# Patient Record
Sex: Male | Born: 1985 | Race: White | Hispanic: No | Marital: Single | Smoking: Former smoker
Health system: Southern US, Community
[De-identification: ages and names within clinical notes are randomized; demographics above are authoritative.]

## PROBLEM LIST (undated history)

## (undated) DIAGNOSIS — R7303 Prediabetes: Secondary | ICD-10-CM

## (undated) HISTORY — DX: Prediabetes: R73.03

---

## 2014-01-29 LAB — BASIC METABOLIC PANEL
BUN: 12 mg/dL (ref 4–21)
CREATININE: 0.9 mg/dL (ref 0.6–1.3)
GLUCOSE: 100 mg/dL

## 2014-01-29 LAB — HEPATIC FUNCTION PANEL
ALT: 79 U/L — AB (ref 10–40)
AST: 42 U/L — AB (ref 14–40)
Alkaline Phosphatase: 76 U/L (ref 25–125)
BILIRUBIN, TOTAL: 0.6 mg/dL

## 2014-01-29 LAB — LIPID PANEL
CHOLESTEROL: 242 mg/dL — AB (ref 0–200)
HDL: 32 mg/dL — AB (ref 35–70)
LDL Cholesterol: 136 mg/dL
LDl/HDL Ratio: 4.2
Triglycerides: 368 mg/dL — AB (ref 40–160)

## 2015-01-04 DIAGNOSIS — K219 Gastro-esophageal reflux disease without esophagitis: Secondary | ICD-10-CM | POA: Insufficient documentation

## 2015-01-04 DIAGNOSIS — E669 Obesity, unspecified: Secondary | ICD-10-CM | POA: Insufficient documentation

## 2015-01-04 DIAGNOSIS — F32A Depression, unspecified: Secondary | ICD-10-CM | POA: Insufficient documentation

## 2015-01-04 DIAGNOSIS — F329 Major depressive disorder, single episode, unspecified: Secondary | ICD-10-CM | POA: Insufficient documentation

## 2015-01-04 DIAGNOSIS — M5136 Other intervertebral disc degeneration, lumbar region: Secondary | ICD-10-CM | POA: Insufficient documentation

## 2015-01-23 ENCOUNTER — Ambulatory Visit (INDEPENDENT_AMBULATORY_CARE_PROVIDER_SITE_OTHER): Payer: Self-pay | Admitting: Family Medicine

## 2015-01-23 ENCOUNTER — Encounter: Payer: Self-pay | Admitting: Family Medicine

## 2015-01-23 VITALS — BP 128/72 | HR 65 | Temp 98.2°F | Wt 280.0 lb

## 2015-01-23 DIAGNOSIS — H6992 Unspecified Eustachian tube disorder, left ear: Secondary | ICD-10-CM

## 2015-01-23 DIAGNOSIS — J302 Other seasonal allergic rhinitis: Secondary | ICD-10-CM

## 2015-01-23 DIAGNOSIS — H9202 Otalgia, left ear: Secondary | ICD-10-CM

## 2015-01-23 MED ORDER — FLUTICASONE PROPIONATE 50 MCG/ACT NA SUSP
2.0000 | Freq: Every day | NASAL | Status: DC
Start: 2015-01-23 — End: 2016-04-27

## 2015-01-23 MED ORDER — LORATADINE-PSEUDOEPHEDRINE ER 5-120 MG PO TB12: 1.0000 | ORAL_TABLET | Freq: Two times a day (BID) | ORAL | Status: DC

## 2015-01-23 MED ORDER — LORATADINE 10 MG PO TABS: 10.0000 mg | ORAL_TABLET | Freq: Every day | ORAL | Status: DC

## 2015-01-23 NOTE — Progress Notes (Signed)
[name], male   DOB: 04-11-1986, 29 y.o.   MRN: [id number]    Alex Perkins  MRN: [id number] DOB: 04-11-1986  Subjective:  HPI   1. Ear pain, left Patient is a 29 year old male who present today with left ear pain.  He states it began yesterday and he complains of pain, pressure and ringing in the ear. He also felt like when he talked he heard it as an echo.   Patient Active Problem List   Diagnosis Date Noted  . DDD (degenerative disc disease), lumbar 01/04/2015  . Clinical depression 01/04/2015  . Acid reflux 01/04/2015  . Adiposity 01/04/2015    No past medical history on file.  History   Social History  . Marital Status: Single    Spouse Name: N/A  . Number of Children: N/A  . Years of Education: N/A   Occupational History  . Not on file.   Social History Main Topics  . Smoking status: Former Smoker -- 2 years  . Smokeless tobacco: Not on file  . Alcohol Use: No  . Drug Use: No     Comment: marijuana in high school  . Sexual Activity: Not on file   Other Topics Concern  . Not on file   Social History Narrative  . No narrative on file    No outpatient prescriptions prior to visit.   No facility-administered medications prior to visit.    No Known Allergies  Review of Systems  Constitutional: Negative for fever, chills and diaphoresis.  HENT: Positive for ear pain, hearing loss and tinnitus. Negative for congestion, ear discharge, nosebleeds and sore throat.        The left ear feels  as if it wants to pop as if he were in the mountains. This is now feeling better.  Eyes: Negative for blurred vision, double vision, photophobia, pain, discharge and redness.  Respiratory: Negative for cough, hemoptysis, sputum production, shortness of breath and wheezing.   Cardiovascular: Positive for chest pain (patient was treated last visit fir GERD).  Neurological: Positive for dizziness and headaches.   Objective:  BP 128/72 mmHg  Pulse 65   Temp(Src) 98.2 F (36.8 C) (Oral)  Wt 280 lb (127.007 kg)  SpO2 97%  Physical Exam  Constitutional: He is oriented to person, place, and time and well-developed, well-nourished, and in no distress.  HENT:  Head: Normocephalic and atraumatic.  Right Ear: External ear normal.  Left Ear: External ear normal.  Nose: Nose normal.  Mouth/Throat: Oropharynx is clear and moist.  Both TMs are full but otherwise this is a normal exam.  Neck: Normal range of motion. Neck supple.  Cardiovascular: Normal rate, regular rhythm and normal heart sounds.   Pulmonary/Chest: Effort normal and breath sounds normal.  Abdominal: Soft.  Neurological: He is alert and oriented to person, place, and time.  Skin: Skin is warm and dry.  Psychiatric: Mood, memory, affect and judgment normal.    Assessment and Plan :  Ear pain, left  - -Pain most likely due to eustachian tube tube dysfunction due to allergic rhinitis. Try Claritin or cautiously Claritin-D. Also considered fluticasone nasal spray.  Mild metabolic syndrome Patient is working hard on diet and exercise and loss of 11 pounds since we saw back this spring. I praised him for this effort.       -   Julieanne Mansonichard Gilbert MD [facility] 01/23/2015 4:01 PM

## 2015-02-05 ENCOUNTER — Encounter: Payer: Self-pay | Admitting: Family Medicine

## 2015-03-06 ENCOUNTER — Encounter: Payer: Self-pay | Admitting: Family Medicine

## 2015-03-12 ENCOUNTER — Encounter: Payer: Self-pay | Admitting: Family Medicine

## 2016-04-27 ENCOUNTER — Ambulatory Visit (INDEPENDENT_AMBULATORY_CARE_PROVIDER_SITE_OTHER): Payer: Self-pay | Admitting: Family Medicine

## 2016-04-27 ENCOUNTER — Encounter: Payer: Self-pay | Admitting: Family Medicine

## 2016-04-27 VITALS — BP 124/88 | HR 92 | Temp 98.5°F | Resp 16 | Wt 282.0 lb

## 2016-04-27 DIAGNOSIS — G4730 Sleep apnea, unspecified: Secondary | ICD-10-CM | POA: Insufficient documentation

## 2016-04-27 DIAGNOSIS — R42 Dizziness and giddiness: Secondary | ICD-10-CM

## 2016-04-27 MED ORDER — MECLIZINE HCL 25 MG PO TABS: ORAL_TABLET | ORAL | 5 refills | Status: DC

## 2016-04-27 MED ORDER — PROMETHAZINE HCL 25 MG PO TABS: ORAL_TABLET | ORAL | 5 refills | Status: DC

## 2016-04-27 NOTE — Patient Instructions (Signed)
Stay on aspirin 325 mg daily.

## 2016-04-27 NOTE — Progress Notes (Signed)
Patient: Alex Perkins Male    DOB: 08-Jun-1986   30 y.o.   MRN: [id number] Visit Date: 04/27/2016  Today's Provider: Megan Mans, MD   Chief Complaint  Patient presents with  . Hospitalization Follow-up   Subjective:    HPI   Follow up ER visit  Patient was seen in ER at [facility] in [place], [place] on 04/22/2016. He was treated for vertigo. Treatment for this included promethazine and meclizine. Also performed EKG, head scan, blood work- all WNL per pt. He reports good compliance with treatment. He reports this condition is Unchanged. Pt reports dizziness has improved, but pt is still c/o nausea, SOB, lightheadedness, weakness, numbness/tingling in top of head, hands and sides. Also c/o visual disturbances (hard to focus, photosensitivity, "looks like TV static). Pt states that this is worse in his left eye, and can feel his pulse in his eye. Pt also states he is quicker to irritability.  Pt is concerned of cardiac involvement at this point. Has a friend who is an MD who states this could be signs of a carotid blockage, and pt wants to get this checked. Pt denies chest pain, headaches, ear pain/pressure. Pt has took ASA 325 mg 2 tablets po this am, with relief of sx.    ------------------------------------------------------------------------------------     No Known Allergies   Current Outpatient Prescriptions:  .  meclizine (ANTIVERT) 25 MG tablet, TK 1 T PO Q 6 H PRF DIZZINESS, Disp: , Rfl: 0 .  naproxen sodium (ANAPROX) 220 MG tablet, Take 220 mg by mouth 2 (two) times daily with a meal., Disp: , Rfl:  .  promethazine (PHENERGAN) 25 MG tablet, TK 1 T PO Q 6 H PRN NV, Disp: , Rfl: 0  Review of Systems  Constitutional: Positive for appetite change, chills and fatigue. Negative for activity change, diaphoresis, fever and unexpected weight change.  HENT: Negative for ear pain.   Eyes: Positive for photophobia and pain.  Respiratory: Positive for shortness  of breath (intermittent, only when pt sleeps. Wakes up gasping for air.).   Cardiovascular: Negative for chest pain, palpitations and leg swelling.  Gastrointestinal: Negative.   Endocrine: Negative.   Allergic/Immunologic: Negative.   Neurological: Positive for weakness, light-headedness and numbness. Negative for dizziness and headaches.  Psychiatric/Behavioral: Negative.     Social History  Substance Use Topics  . Smoking status: Former Smoker    Years: 2.00  . Smokeless tobacco: Not on file  . Alcohol use No   Objective:   BP 124/88 (BP Location: Left Arm, Patient Position: Sitting, Cuff Size: Large)   Pulse 92   Temp 98.5 F (36.9 C) (Oral)   Resp 16   Wt 282 lb (127.9 kg)   Physical Exam  Constitutional: He is oriented to person, place, and time. He appears well-developed and well-nourished.  HENT:  Head: Normocephalic and atraumatic.  Right Ear: External ear normal.  Left Ear: External ear normal.  Nose: Nose normal.  Mouth/Throat: Oropharynx is clear and moist.  Eyes: No scleral icterus. Right eye exhibits no nystagmus. Left eye exhibits no nystagmus.  peripheral vision WNL.  Neck: No thyromegaly present.  Cardiovascular: Normal rate, regular rhythm, normal heart sounds and intact distal pulses.   No carotid bruit  Pulmonary/Chest: Effort normal and breath sounds normal. No respiratory distress.  Musculoskeletal: He exhibits no edema.  Lymphadenopathy:    He has no cervical adenopathy.  Neurological: He is alert and oriented to person, place,  and time. He has normal reflexes. He displays normal reflexes. No cranial nerve deficit. He exhibits normal muscle tone. Coordination normal.  Non-focal  Skin: Skin is warm and dry.  Psychiatric: He has a normal mood and affect. His behavior is normal. Judgment and thought content normal.        Assessment & Plan:     1. Sleep apnea, unspecified type Scored 11 on the Epworth scale. Denies snoring. Will hold off on  sleep study for now.  2. Dizziness Not to goal. Believe this is secondary to migraine headaches. Will refer to neurology for further evaluation, and to rule out plaque on carotids. Refill medications.  - Ambulatory referral to Neurology - promethazine (PHENERGAN) 25 MG tablet; TK 1 T PO Q 6 H PRN NV  Dispense: 30 tablet; Refill: 5 - meclizine (ANTIVERT) 25 MG tablet; TK 1 T PO Q 6 H PRF DIZZINESS  Dispense: 30 tablet; Refill: 5     Patient seen and examined by Julieanne Mansonichard Gilbert, MD, and note scribed by Allene DillonEmily Drozdowski, CMA.  I have done the exam and reviewed the above chart and it is accurate to the best of my knowledge.  Richard Wendelyn BreslowGilbert Jr, MD  [facility]

## 2016-04-28 ENCOUNTER — Inpatient Hospital Stay: Payer: Self-pay | Admitting: Family Medicine

## 2016-05-07 ENCOUNTER — Encounter: Payer: Self-pay | Admitting: Family Medicine

## 2016-06-23 ENCOUNTER — Encounter: Payer: Self-pay | Admitting: Family Medicine

## 2016-06-23 ENCOUNTER — Ambulatory Visit (INDEPENDENT_AMBULATORY_CARE_PROVIDER_SITE_OTHER): Payer: Self-pay | Admitting: Family Medicine

## 2016-06-23 VITALS — BP 118/78 | HR 94 | Temp 98.4°F | Resp 18 | Wt 287.0 lb

## 2016-06-23 DIAGNOSIS — R739 Hyperglycemia, unspecified: Secondary | ICD-10-CM

## 2016-06-23 DIAGNOSIS — R42 Dizziness and giddiness: Secondary | ICD-10-CM

## 2016-06-23 DIAGNOSIS — F419 Anxiety disorder, unspecified: Secondary | ICD-10-CM

## 2016-06-23 DIAGNOSIS — E785 Hyperlipidemia, unspecified: Secondary | ICD-10-CM

## 2016-06-23 MED ORDER — SERTRALINE HCL 50 MG PO TABS: 50.0000 mg | ORAL_TABLET | Freq: Every day | ORAL | 11 refills | Status: DC

## 2016-06-23 NOTE — Progress Notes (Signed)
Subjective:  HPI Pt is here today for a follow up of vertigo and what he thinks is anxiety. He reports that every since his bout of vertigo and syncope he has had anxiety. He will have a flushed feeling and have chest tightness, dizziness and shaking. He can talk himself calm and the symptoms go away. He reports that the dizziness feels different than the vertigo did in the past. He reports that he has a lot going on with work right now. He also states that he has had high cholesterol in the past and prediabetic. He would like to have his labs checked today also. He had EKG before when he had the episode back in October.   Prior to Admission medications   Medication Sig Start Date End Date Taking? Authorizing Provider  cyanocobalamin 1000 MCG tablet Take 1,000 mcg by mouth daily.   Yes Historical Provider, MD  meclizine (ANTIVERT) 25 MG tablet TK 1 T PO Q 6 H PRF DIZZINESS 04/27/16  Yes Maple Hudsonichard L Kelsa Jaworowski Jr., MD  promethazine (PHENERGAN) 25 MG tablet TK 1 T PO Q 6 H PRN NV 04/27/16  Yes Cherl Gorney Hulen ShoutsL Jon Lall Jr., MD  naproxen sodium (ANAPROX) 220 MG tablet Take 220 mg by mouth 2 (two) times daily with a meal.    Historical Provider, MD    Patient Active Problem List   Diagnosis Date Noted  . Sleep apnea 04/27/2016  . DDD (degenerative disc disease), lumbar 01/04/2015  . Clinical depression 01/04/2015  . Acid reflux 01/04/2015  . Adiposity 01/04/2015    History reviewed. No pertinent past medical history.  Social History   Social History  . Marital status: Single    Spouse name: N/A  . Number of children: N/A  . Years of education: N/A   Occupational History  . Not on file.   Social History Main Topics  . Smoking status: Former Smoker    Years: 2.00  . Smokeless tobacco: Never Used  . Alcohol use No  . Drug use: No     Comment: marijuana in high school  . Sexual activity: Not on file   Other Topics Concern  . Not on file   Social History Narrative  . No narrative on  file    No Known Allergies  Review of Systems  HENT: Negative.   Eyes: Negative.   Respiratory: Positive for shortness of breath.   Cardiovascular: Positive for chest pain (tightness).  Gastrointestinal: Positive for nausea.  Genitourinary: Negative.   Musculoskeletal: Positive for neck pain.  Neurological: Positive for dizziness.  Endo/Heme/Allergies: Negative.   Psychiatric/Behavioral: The patient is nervous/anxious ("shakey").      There is no immunization history on file for this patient.  Objective:  BP 118/78 (BP Location: Left Arm, Patient Position: Sitting, Cuff Size: Large)   Pulse 94   Temp 98.4 F (36.9 C) (Oral)   Resp 18   Wt 287 lb (130.2 kg)   Physical Exam  Constitutional: He is oriented to person, place, and time and well-developed, well-nourished, and in no distress.  HENT:  Head: Normocephalic and atraumatic.  Right Ear: External ear normal.  Left Ear: External ear normal.  Nose: Nose normal.  Mouth/Throat: Oropharynx is clear and moist.  Eyes: Conjunctivae and EOM are normal. Pupils are equal, round, and reactive to light.  Neck: Normal range of motion. Neck supple.  Cardiovascular: Normal rate, regular rhythm, normal heart sounds and intact distal pulses.   Pulmonary/Chest: Effort normal and breath sounds normal.  Musculoskeletal: Normal range of motion.  Neurological: He is alert and oriented to person, place, and time. He has normal reflexes. Gait normal. GCS score is 15.  Nonfocal exam.  Skin: Skin is warm and dry.  Psychiatric: Mood, memory, affect and judgment normal.    Lab Results  Component Value Date   CHOL 242 (A) 01/29/2014   TRIG 368 (A) 01/29/2014   HDL 32 (A) 01/29/2014   LDLCALC 136 01/29/2014    CMP     Component Value Date/Time   BUN 12 01/29/2014   CREATININE 0.9 01/29/2014   AST 42 (A) 01/29/2014   ALT 79 (A) 01/29/2014   ALKPHOS 76 01/29/2014    Assessment and Plan :  1. Dizziness Neurologic exam nonfocal.  Neuro referral includes a complete workup by Dr. Sherryll BurgerShah.. MRI pending. Migraine without headache is the diagnosis by Dr. Sherryll BurgerShah  and I think this is accurate. - CBC with Differential/Platelet  2. Anxiety--Acute on chronic Try Sertraline follow up in 1 month. I do think patient has migraine  issue going on. I think recent symptoms are due to anxiety. I discussed with him through the years and need to lose weight and get lipids down. He is very anxious about all of these issues now. We'll try to avoid benzodiazepines. I think counseling would be very helpful with this patient. - TSH - sertraline (ZOLOFT) 50 MG tablet; Take 1 tablet (50 mg total) by mouth daily.  Dispense: 30 tablet; Refill: 11  3. Hyperlipidemia, unspecified hyperlipidemia type  - Lipid Panel With LDL/HDL Ratio - Comprehensive metabolic panel  4. Hyperglycemia Elevated in the past and at home reading. Follow up pending labs. - Hemoglobin A1c 5. Obesity Changes in diet and exercise habits or stressed. We have been discussing this for years the patient has had a great difficulty in making changes HPI, Exam, and A&P Transcribed under the direction and in the presence of Nikita Surman L. Wendelyn BreslowGilbert Jr, MD  Electronically Signed: Dimas ChyleBrittany Byrd, CMA  Julieanne Mansonichard Renly Roots MD [facility] 06/23/2016 3:09 PM

## 2016-07-03 DIAGNOSIS — F419 Anxiety disorder, unspecified: Secondary | ICD-10-CM

## 2016-07-03 DIAGNOSIS — E785 Hyperlipidemia, unspecified: Secondary | ICD-10-CM

## 2016-07-03 DIAGNOSIS — R42 Dizziness and giddiness: Secondary | ICD-10-CM

## 2016-07-04 LAB — COMPREHENSIVE METABOLIC PANEL
ALBUMIN: 4.7 g/dL (ref 3.5–5.5)
ALT: 67 IU/L — ABNORMAL HIGH (ref 0–44)
AST: 34 IU/L (ref 0–40)
Albumin/Globulin Ratio: 2.1 (ref 1.2–2.2)
Alkaline Phosphatase: 67 IU/L (ref 39–117)
BUN / CREAT RATIO: 11 (ref 9–20)
BUN: 10 mg/dL (ref 6–20)
Bilirubin Total: 0.6 mg/dL (ref 0.0–1.2)
CALCIUM: 9.7 mg/dL (ref 8.7–10.2)
CO2: 21 mmol/L (ref 18–29)
CREATININE: 0.9 mg/dL (ref 0.76–1.27)
Chloride: 101 mmol/L (ref 96–106)
GFR calc Af Amer: 130 mL/min/{1.73_m2} (ref >59)
GFR, EST NON AFRICAN AMERICAN: 113 mL/min/{1.73_m2} (ref >59)
GLOBULIN, TOTAL: 2.2 g/dL (ref 1.5–4.5)
GLUCOSE: 107 mg/dL — AB (ref 65–99)
Potassium: 4.8 mmol/L (ref 3.5–5.2)
SODIUM: 141 mmol/L (ref 134–144)
TOTAL PROTEIN: 6.9 g/dL (ref 6.0–8.5)

## 2016-07-04 LAB — CBC WITH DIFFERENTIAL/PLATELET
BASOS ABS: 0.1 10*3/uL (ref 0.0–0.2)
Basos: 1 %
EOS (ABSOLUTE): 0.1 10*3/uL (ref 0.0–0.4)
Eos: 2 %
HEMOGLOBIN: 16.1 g/dL (ref 13.0–17.7)
Hematocrit: 47 % (ref 37.5–51.0)
Immature Grans (Abs): 0.1 10*3/uL (ref 0.0–0.1)
Immature Granulocytes: 1 %
LYMPHS ABS: 2.9 10*3/uL (ref 0.7–3.1)
Lymphs: 45 %
MCH: 29.7 pg (ref 26.6–33.0)
MCHC: 34.3 g/dL (ref 31.5–35.7)
MCV: 87 fL (ref 79–97)
MONOCYTES: 6 %
Monocytes Absolute: 0.4 10*3/uL (ref 0.1–0.9)
Neutrophils Absolute: 2.9 10*3/uL (ref 1.4–7.0)
Neutrophils: 45 %
PLATELETS: 280 10*3/uL (ref 150–379)
RBC: 5.42 x10E6/uL (ref 4.14–5.80)
RDW: 13.6 % (ref 12.3–15.4)
WBC: 6.4 10*3/uL (ref 3.4–10.8)

## 2016-07-04 LAB — LIPID PANEL WITH LDL/HDL RATIO
CHOLESTEROL TOTAL: 233 mg/dL — AB (ref 100–199)
HDL: 35 mg/dL — ABNORMAL LOW (ref >39)
LDL CALC: 145 mg/dL — AB (ref 0–99)
LDl/HDL Ratio: 4.1 ratio units — ABNORMAL HIGH (ref 0.0–3.6)
TRIGLYCERIDES: 263 mg/dL — AB (ref 0–149)
VLDL Cholesterol Cal: 53 mg/dL — ABNORMAL HIGH (ref 5–40)

## 2016-07-04 LAB — HEMOGLOBIN A1C
ESTIMATED AVERAGE GLUCOSE: 123 mg/dL
HEMOGLOBIN A1C: 5.9 % — AB (ref 4.8–5.6)

## 2016-07-04 LAB — TSH: TSH: 1.41 u[IU]/mL (ref 0.450–4.500)

## 2016-07-07 ENCOUNTER — Telehealth: Payer: Self-pay

## 2016-07-07 NOTE — Telephone Encounter (Signed)
-----   Message from Maple Hudsonichard L Gilbert Jr., MD sent at 07/07/2016  1:43 PM EST ----- Lipids moderately elevated. Patient prediabetic. Fatty liver a little better. Work hard on diet and exercise.

## 2016-07-07 NOTE — Telephone Encounter (Signed)
Patient advised as below. Patient verbalizes understanding and is in agreement with treatment plan.  

## 2016-07-22 ENCOUNTER — Ambulatory Visit: Payer: Self-pay | Admitting: Family Medicine

## 2016-09-10 ENCOUNTER — Ambulatory Visit (INDEPENDENT_AMBULATORY_CARE_PROVIDER_SITE_OTHER): Payer: Self-pay | Admitting: Family Medicine

## 2016-09-10 ENCOUNTER — Encounter: Payer: Self-pay | Admitting: Family Medicine

## 2016-09-10 VITALS — BP 118/82 | HR 84 | Temp 98.4°F | Resp 14 | Wt 283.0 lb

## 2016-09-10 DIAGNOSIS — R519 Headache, unspecified: Secondary | ICD-10-CM

## 2016-09-10 DIAGNOSIS — B349 Viral infection, unspecified: Secondary | ICD-10-CM

## 2016-09-10 DIAGNOSIS — R51 Headache: Secondary | ICD-10-CM

## 2016-09-10 DIAGNOSIS — F419 Anxiety disorder, unspecified: Secondary | ICD-10-CM

## 2016-09-10 NOTE — Patient Instructions (Signed)
Can take Robitussin over the counter. Push fluids, rest. For headache if you want to Aspirin can be used. Follow as needed. Keep your appointment with neurologist and get MRI done.

## 2016-09-10 NOTE — Progress Notes (Signed)
Alex ContesShaun A Perkins  MRN: [id number] DOB: 1986-04-28  Subjective:  HPI  Patient has not felt good for about 3 tyo 4 days now. His kids had fever and their mom has had fever and stomach issue for the past few days. Patient symptoms have been sinus pain, sinus pressure, teeth pain, chest congestion, cough, runny nose, head congestion, headache. No fever, no body aches, no chills. He is concerned with a severe headache he has gotten started yesterday 09/09/16 after coughing with all the drainage he has had he developed a sudden pulsation headache that was located behind the neck and his head and pulsating sensation lasted for about 15 seconds and went away and then just had a mild headache for the rest of the day. This happened today also after coughing. He has been taking Loratadine, using saline spray, and used humidifier. He took Phenergan today due to some nausea cause by the headache. Patient Active Problem List   Diagnosis Date Noted  . Sleep apnea 04/27/2016  . DDD (degenerative disc disease), lumbar 01/04/2015  . Clinical depression 01/04/2015  . Acid reflux 01/04/2015  . Adiposity 01/04/2015    No past medical history on file.  Social History   Social History  . Marital status: Single    Spouse name: N/A  . Number of children: N/A  . Years of education: N/A   Occupational History  . Not on file.   Social History Main Topics  . Smoking status: Former Smoker    Years: 2.00  . Smokeless tobacco: Never Used  . Alcohol use No  . Drug use: No     Comment: marijuana in high school  . Sexual activity: Not on file   Other Topics Concern  . Not on file   Social History Narrative  . No narrative on file    Outpatient Encounter Prescriptions as of 09/10/2016  Medication Sig  . cyanocobalamin 1000 MCG tablet Take 1,000 mcg by mouth daily.  Marland Kitchen. loratadine (CLARITIN) 10 MG tablet Take 10 mg by mouth daily.  . meclizine (ANTIVERT) 25 MG tablet TK 1 T PO Q 6 H PRF DIZZINESS  .  promethazine (PHENERGAN) 25 MG tablet TK 1 T PO Q 6 H PRN NV  . naproxen sodium (ANAPROX) 220 MG tablet Take 220 mg by mouth 2 (two) times daily with a meal.  . [DISCONTINUED] sertraline (ZOLOFT) 50 MG tablet Take 1 tablet (50 mg total) by mouth daily.   No facility-administered encounter medications on file as of 09/10/2016.     No Known Allergies  Review of Systems  Constitutional: Positive for malaise/fatigue.  HENT: Positive for congestion and sinus pain.        Runny nose, sinus pressure, post nasal drainage.  Respiratory: Positive for cough and sputum production. Negative for shortness of breath and wheezing.   Cardiovascular: Negative.        Chest congestion  Gastrointestinal: Positive for nausea.  Musculoskeletal: Positive for myalgias and neck pain.  Neurological: Positive for headaches.  Psychiatric/Behavioral: The patient is nervous/anxious.     Objective:  BP 118/82   Pulse 84   Temp 98.4 F (36.9 C)   Resp 14   Wt 283 lb (128.4 kg)   SpO2 97%   Physical Exam  Constitutional: He is oriented to person, place, and time and well-developed, well-nourished, and in no distress.  HENT:  Head: Normocephalic and atraumatic.  Eyes: Conjunctivae are normal. Pupils are equal, round, and reactive to light. Right eye exhibits nystagmus (  mild). Left eye exhibits nystagmus (mild).  Neck: Normal range of motion. Neck supple.  Cardiovascular: Normal rate, regular rhythm, normal heart sounds and intact distal pulses.   No murmur heard. Pulmonary/Chest: Effort normal and breath sounds normal. No respiratory distress. He has no wheezes.  Neurological: He is alert and oriented to person, place, and time. No cranial nerve deficit. He exhibits normal muscle tone. Gait normal. Coordination normal. GCS score is 15.  Skin: Skin is warm and dry.  Psychiatric: Mood, memory, affect and judgment normal.    Assessment and Plan :  1. Viral infection I think symptoms are viral. Discussed with  patient pushing fluids and continue OTC treatments. Follow as needed. I do not think steroid treatment or antibiotics at this time. 2. Headache in back of head Patient contacted neurologist and made an appointment to follow up on this and re scheduled MRI of the brain. Advised patient to follow through with this. Advised patient to use Meclizine as needed if develops dizziness.  3. Anxiety Patient is better. Did not need to take Zoloft and patient feels fine without medications at this time. Follow. HPI, Exam and A&P transcribed under direction and in the presence of Alex Manson, MD. I have done the exam and reviewed the chart and it is accurate to the best of my knowledge. Dentist has been used and  any errors in dictation or transcription are unintentional. Alex Perkins M.D. [facility]

## 2017-09-07 ENCOUNTER — Ambulatory Visit: Payer: Self-pay | Admitting: Family Medicine

## 2017-09-07 VITALS — BP 120/80 | HR 88 | Temp 97.7°F | Resp 16 | Wt 294.0 lb

## 2017-09-07 DIAGNOSIS — G4730 Sleep apnea, unspecified: Secondary | ICD-10-CM

## 2017-09-07 DIAGNOSIS — E782 Mixed hyperlipidemia: Secondary | ICD-10-CM

## 2017-09-07 DIAGNOSIS — J029 Acute pharyngitis, unspecified: Secondary | ICD-10-CM

## 2017-09-07 LAB — POCT RAPID STREP A (OFFICE): RAPID STREP A SCREEN: NEGATIVE

## 2017-09-07 NOTE — Progress Notes (Signed)
Alex ContesShaun A Perkins  MRN: [id number] DOB: [birth year]  Subjective:  HPI   The patient is a 32 year old male who presents for evaluation of sore throat with white spots.  He states he started with sore throat today.  He has 2 young children that have recently been sick and his wife was diagnosed with the flu last week.  The patient states he does not feel sick he just has the sore throat.   Patient Active Problem List   Diagnosis Date Noted  . Sleep apnea 04/27/2016  . DDD (degenerative disc disease), lumbar 01/04/2015  . Clinical depression 01/04/2015  . Acid reflux 01/04/2015  . Adiposity 01/04/2015    No past medical history on file.  Social History   Socioeconomic History  . Marital status: Single    Spouse name: Not on file  . Number of children: Not on file  . Years of education: Not on file  . Highest education level: Not on file  Social Needs  . Financial resource strain: Not on file  . Food insecurity - worry: Not on file  . Food insecurity - inability: Not on file  . Transportation needs - medical: Not on file  . Transportation needs - non-medical: Not on file  Occupational History  . Not on file  Tobacco Use  . Smoking status: Former Smoker    Years: 2.00  . Smokeless tobacco: Never Used  Substance and Sexual Activity  . Alcohol use: No    Alcohol/week: 0.0 oz  . Drug use: No    Comment: marijuana in high school  . Sexual activity: Not on file  Other Topics Concern  . Not on file  Social History Narrative  . Not on file    Outpatient Encounter Medications as of 09/07/2017  Medication Sig  . [DISCONTINUED] cyanocobalamin 1000 MCG tablet Take 1,000 mcg by mouth daily.  . [DISCONTINUED] loratadine (CLARITIN) 10 MG tablet Take 10 mg by mouth daily.  . [DISCONTINUED] meclizine (ANTIVERT) 25 MG tablet TK 1 T PO Q 6 H PRF DIZZINESS  . [DISCONTINUED] naproxen sodium (ANAPROX) 220 MG tablet Take 220 mg by mouth 2 (two) times daily with a meal.  . [DISCONTINUED]  promethazine (PHENERGAN) 25 MG tablet TK 1 T PO Q 6 H PRN NV   No facility-administered encounter medications on file as of 09/07/2017.     No Known Allergies  Review of Systems  Constitutional: Negative for fever and malaise/fatigue.  HENT: Positive for sore throat. Negative for congestion and sinus pain.   Eyes: Negative.   Respiratory: Positive for cough (last week, none now). Negative for shortness of breath and wheezing.   Cardiovascular: Negative for chest pain and palpitations.  Gastrointestinal: Negative.   Neurological: Negative for weakness.  Endo/Heme/Allergies: Negative.   Psychiatric/Behavioral: Negative.     Objective:  BP 120/80 (BP Location: Right Arm, Patient Position: Sitting, Cuff Size: Normal)   Pulse 88   Temp 97.7 F (36.5 C) (Oral)   Resp 16   Wt 294 lb (133.4 kg)   Oropharynx--clear--no exudates Neck--no adenopathy Lungs-clear COR--RRR   Assessment and Plan :  1. Sore throat Viral--no antibiotics needed. - POCT rapid strep A--negative 2.HLD 3.Obesity These issues discussed at length with pt--goal waist of 36--now 40--check lipids in future.   I have done the exam and reviewed the chart and it is accurate to the best of my knowledge. DentistDragon  technology has been used and  any errors in dictation or transcription are unintentional.  Miguel Aschoff M.D. [facility]

## 2018-03-15 ENCOUNTER — Encounter: Payer: Self-pay | Admitting: Family Medicine

## 2018-04-26 ENCOUNTER — Telehealth: Payer: Self-pay | Admitting: Family Medicine

## 2018-04-26 NOTE — Telephone Encounter (Signed)
I don't see this on his list.  Would you need to see him first?

## 2018-04-26 NOTE — Telephone Encounter (Signed)
Pt needs Promethazine and Meclozine for vertigo.  He says he takes both of these for vertigo  Alex Perkins  CB# [phone]  Thanks Barth Kirks

## 2018-04-27 NOTE — Telephone Encounter (Signed)
Pt called back checking on the status of the medication requests.  Thanks, [employer]

## 2018-04-27 NOTE — Telephone Encounter (Signed)
Dr Sullivan Lone, can we give this to him

## 2018-04-28 MED ORDER — MECLIZINE HCL 25 MG PO TABS: 25.0000 mg | ORAL_TABLET | Freq: Four times a day (QID) | ORAL | 0 refills | Status: DC | PRN

## 2018-04-28 MED ORDER — PROMETHAZINE HCL 25 MG PO TABS: 25.0000 mg | ORAL_TABLET | Freq: Four times a day (QID) | ORAL | 0 refills | Status: DC | PRN

## 2018-04-28 NOTE — Telephone Encounter (Signed)
Ok --1 month--no refills.

## 2018-04-28 NOTE — Telephone Encounter (Signed)
Electronically filed. KW

## 2018-07-04 ENCOUNTER — Telehealth: Payer: Self-pay

## 2018-07-04 ENCOUNTER — Other Ambulatory Visit: Payer: Self-pay | Admitting: Family Medicine

## 2018-07-04 MED ORDER — MECLIZINE HCL 25 MG PO TABS: 25.0000 mg | ORAL_TABLET | Freq: Four times a day (QID) | ORAL | 0 refills | Status: DC | PRN

## 2018-07-04 NOTE — Telephone Encounter (Signed)
Medication has been sent in 

## 2018-07-04 NOTE — Telephone Encounter (Signed)
Pt advised of rx sent to pharmacy.  dbs 

## 2018-07-04 NOTE — Telephone Encounter (Signed)
Please review

## 2018-07-04 NOTE — Telephone Encounter (Signed)
Patient called requesting refills on meclizine. He reports that he has a follow up appt with Dr. Sullivan LoneGilbert on Thurs (1/2), but would like the medication today if possible. Patient uses [facility] on [address]. Please call patient to let him know if this has been called in. Contact info is correct. Thanks!

## 2018-07-07 ENCOUNTER — Encounter: Payer: Self-pay | Admitting: Family Medicine

## 2018-07-07 ENCOUNTER — Ambulatory Visit (INDEPENDENT_AMBULATORY_CARE_PROVIDER_SITE_OTHER): Payer: Self-pay | Admitting: Family Medicine

## 2018-07-07 VITALS — BP 134/93 | HR 86 | Temp 98.2°F | Wt 300.0 lb

## 2018-07-07 DIAGNOSIS — R030 Elevated blood-pressure reading, without diagnosis of hypertension: Secondary | ICD-10-CM

## 2018-07-07 DIAGNOSIS — G43901 Migraine, unspecified, not intractable, with status migrainosus: Secondary | ICD-10-CM

## 2018-07-07 DIAGNOSIS — F418 Other specified anxiety disorders: Secondary | ICD-10-CM

## 2018-07-07 DIAGNOSIS — E785 Hyperlipidemia, unspecified: Secondary | ICD-10-CM

## 2018-07-07 DIAGNOSIS — R42 Dizziness and giddiness: Secondary | ICD-10-CM

## 2018-07-07 NOTE — Progress Notes (Signed)
Patient: Alex Perkins Male    DOB: 05-17-1986   33 y.o.   MRN: [id number] Visit Date: 07/07/2018  Today's Provider: Wilhemena Durie, MD   Chief Complaint  Patient presents with  . Dizziness   Subjective:     Dizziness  This is a recurrent problem. Episode onset: October. Episode frequency: episodes. The problem has been unchanged. Associated symptoms include vertigo and a visual change. Associated symptoms comments: Ear pain, appetite change. Nothing aggravates the symptoms. Treatments tried: Meclizine, Promethazine, and Aleve. The treatment provided mild relief.  The vertigo symptoms occurred in October 2017 and then again in October 2019.  They lasted for short while and resolved.  Visual symptoms are the ones that he continues to have.  They are not continuous.  No visual field defects.  He says he is outside a lot and is worried about the possibility of Lyme's disease.  No Known Allergies   Current Outpatient Medications:  .  meclizine (ANTIVERT) 25 MG tablet, Take 1 tablet (25 mg total) by mouth every 6 (six) hours as needed for dizziness., Disp: 30 tablet, Rfl: 0 .  promethazine (PHENERGAN) 25 MG tablet, Take 1 tablet (25 mg total) by mouth every 6 (six) hours as needed for nausea or vomiting., Disp: 30 tablet, Rfl: 0 .  cyclobenzaprine (FLEXERIL) 10 MG tablet, cyclobenzaprine 10 mg tablet  TK 1 T PO Q 8 H PRF SPASM, Disp: , Rfl:   Review of Systems  Constitutional: Positive for appetite change.  HENT: Positive for sinus pain.        He does complain of sinus congestion after these episodes.  No real pain. He says that when it occurs he puts an ice pack on his neck and in a few hours he feels better  Eyes: Positive for visual disturbance.  Respiratory: Negative.   Cardiovascular: Negative.   Gastrointestinal: Negative.   Endocrine: Negative.   Genitourinary: Negative.   Musculoskeletal: Negative.   Allergic/Immunologic: Negative.   Neurological: Positive for  dizziness and vertigo.  Psychiatric/Behavioral: Negative.     Social History   Tobacco Use  . Smoking status: Former Smoker    Years: 2.00  . Smokeless tobacco: Never Used  Substance Use Topics  . Alcohol use: No    Alcohol/week: 0.0 standard drinks      Objective:   BP (!) 134/93 (BP Location: Right Arm, Patient Position: Sitting, Cuff Size: Large)   Pulse 86   Temp 98.2 F (36.8 C) (Oral)   Wt 300 lb (136.1 kg)  Vitals:   07/07/18 0814  BP: (!) 134/93  Pulse: 86  Temp: 98.2 F (36.8 C)  TempSrc: Oral  Weight: 300 lb (136.1 kg)     Physical Exam Constitutional:      Appearance: Normal appearance. He is normal weight.  HENT:     Head: Normocephalic and atraumatic.     Right Ear: Tympanic membrane, ear canal and external ear normal.     Left Ear: Tympanic membrane, ear canal and external ear normal.     Nose: Nose normal.  Eyes:     Extraocular Movements: Extraocular movements intact.     Conjunctiva/sclera: Conjunctivae normal.     Pupils: Pupils are equal, round, and reactive to light.  Neck:     Musculoskeletal: Normal range of motion and neck supple.  Cardiovascular:     Rate and Rhythm: Normal rate and regular rhythm.     Pulses: Normal pulses.  Heart sounds: Normal heart sounds.  Pulmonary:     Effort: Pulmonary effort is normal.     Breath sounds: Normal breath sounds.  Abdominal:     General: Bowel sounds are normal.  Musculoskeletal: Normal range of motion.  Neurological:     General: No focal deficit present.     Mental Status: He is alert and oriented to person, place, and time. Mental status is at baseline.     Cranial Nerves: No cranial nerve deficit.     Sensory: No sensory deficit.     Motor: No weakness.     Coordination: Coordination normal.     Gait: Gait normal.     Comments: Completely nonfocal exam.  Psychiatric:        Mood and Affect: Mood normal.        Behavior: Behavior normal.        Thought Content: Thought content  normal.        Judgment: Judgment normal.         Assessment & Plan    1. Vertigo Exam  today is normal. - TSH - Sed Rate (ESR) - Ambulatory referral to Neurology - Ambulatory referral to Ophthalmology  2. Hyperlipidemia, unspecified hyperlipidemia type Recheck lipids. - TSH - Lipid Profile - Sed Rate (ESR)  3. Migraine with status migrainosus, not intractable, unspecified migraine type I think this is the most likely diagnosis.  Refer back to neurology.  We will go ahead and refer to see ophthalmology after he sees neurology.  He may not need to go to this appointment if neurology clears him. - TSH - Sed Rate (ESR) - Ambulatory referral to Neurology - Ambulatory referral to Ophthalmology  4. Blood pressure elevated without history of HTN He plans to work on lifestyle this year.  I will see him back in 2 to 4 months to recheck blood pressure. - CBC with Differential - Comprehensive Metabolic Panel (CMET) 5.  Anxiety I think this is become a significant issue for this patient.  Hopefully neurology can give him some answers along with normal lab work that will make him more comfortable.  We will treat as indicated.    Ryen Rhames Cranford Mon, MD  [facility]

## 2018-07-08 LAB — CBC WITH DIFFERENTIAL/PLATELET
BASOS: 1 %
Basophils Absolute: 0 10*3/uL (ref 0.0–0.2)
EOS (ABSOLUTE): 0.2 10*3/uL (ref 0.0–0.4)
Eos: 3 %
HEMOGLOBIN: 16.1 g/dL (ref 13.0–17.7)
Hematocrit: 46.6 % (ref 37.5–51.0)
IMMATURE GRANS (ABS): 0 10*3/uL (ref 0.0–0.1)
Immature Granulocytes: 1 %
LYMPHS: 42 %
Lymphocytes Absolute: 2.3 10*3/uL (ref 0.7–3.1)
MCH: 30.7 pg (ref 26.6–33.0)
MCHC: 34.5 g/dL (ref 31.5–35.7)
MCV: 89 fL (ref 79–97)
Monocytes Absolute: 0.5 10*3/uL (ref 0.1–0.9)
Monocytes: 9 %
Neutrophils Absolute: 2.5 10*3/uL (ref 1.4–7.0)
Neutrophils: 44 %
Platelets: 251 10*3/uL (ref 150–450)
RBC: 5.25 x10E6/uL (ref 4.14–5.80)
RDW: 12.3 % (ref 12.3–15.4)
WBC: 5.5 10*3/uL (ref 3.4–10.8)

## 2018-07-08 LAB — COMPREHENSIVE METABOLIC PANEL
ALBUMIN: 4.6 g/dL (ref 3.5–5.5)
ALT: 67 IU/L — ABNORMAL HIGH (ref 0–44)
AST: 32 IU/L (ref 0–40)
Albumin/Globulin Ratio: 2.2 (ref 1.2–2.2)
Alkaline Phosphatase: 84 IU/L (ref 39–117)
BUN/Creatinine Ratio: 13 (ref 9–20)
BUN: 12 mg/dL (ref 6–20)
Bilirubin Total: 0.5 mg/dL (ref 0.0–1.2)
CO2: 23 mmol/L (ref 20–29)
Calcium: 9.6 mg/dL (ref 8.7–10.2)
Chloride: 103 mmol/L (ref 96–106)
Creatinine, Ser: 0.96 mg/dL (ref 0.76–1.27)
GFR calc non Af Amer: 103 mL/min/{1.73_m2} (ref >59)
GFR, EST AFRICAN AMERICAN: 119 mL/min/{1.73_m2} (ref >59)
GLOBULIN, TOTAL: 2.1 g/dL (ref 1.5–4.5)
Glucose: 127 mg/dL — ABNORMAL HIGH (ref 65–99)
Potassium: 4.8 mmol/L (ref 3.5–5.2)
Sodium: 142 mmol/L (ref 134–144)
TOTAL PROTEIN: 6.7 g/dL (ref 6.0–8.5)

## 2018-07-08 LAB — LIPID PANEL
Chol/HDL Ratio: 6.6 ratio — ABNORMAL HIGH (ref 0.0–5.0)
Cholesterol, Total: 230 mg/dL — ABNORMAL HIGH (ref 100–199)
HDL: 35 mg/dL — ABNORMAL LOW (ref >39)
LDL Calculated: 166 mg/dL — ABNORMAL HIGH (ref 0–99)
Triglycerides: 145 mg/dL (ref 0–149)
VLDL Cholesterol Cal: 29 mg/dL (ref 5–40)

## 2018-07-08 LAB — TSH: TSH: 1.42 u[IU]/mL (ref 0.450–4.500)

## 2018-07-08 LAB — SEDIMENTATION RATE: Sed Rate: 8 mm/hr (ref 0–15)

## 2018-08-31 ENCOUNTER — Encounter: Payer: Self-pay | Admitting: Family Medicine

## 2018-08-31 ENCOUNTER — Other Ambulatory Visit: Payer: Self-pay

## 2018-08-31 ENCOUNTER — Ambulatory Visit (INDEPENDENT_AMBULATORY_CARE_PROVIDER_SITE_OTHER): Payer: Self-pay | Admitting: Family Medicine

## 2018-08-31 VITALS — BP 120/78 | HR 88 | Temp 98.1°F | Ht 73.0 in | Wt 292.4 lb

## 2018-08-31 DIAGNOSIS — G4730 Sleep apnea, unspecified: Secondary | ICD-10-CM

## 2018-08-31 DIAGNOSIS — Z87898 Personal history of other specified conditions: Secondary | ICD-10-CM

## 2018-08-31 DIAGNOSIS — Z6838 Body mass index (BMI) 38.0-38.9, adult: Secondary | ICD-10-CM

## 2018-08-31 DIAGNOSIS — J309 Allergic rhinitis, unspecified: Secondary | ICD-10-CM

## 2018-08-31 DIAGNOSIS — E6609 Other obesity due to excess calories: Secondary | ICD-10-CM

## 2018-08-31 MED ORDER — MONTELUKAST SODIUM 10 MG PO TABS: 10.0000 mg | ORAL_TABLET | Freq: Every day | ORAL | 11 refills | Status: DC

## 2018-08-31 MED ORDER — CETIRIZINE HCL 10 MG PO TABS: 10.0000 mg | ORAL_TABLET | Freq: Every day | ORAL | 11 refills | Status: AC

## 2018-08-31 MED ORDER — FLUTICASONE PROPIONATE 50 MCG/ACT NA SUSP: 2.0000 | Freq: Every day | NASAL | 6 refills | Status: AC

## 2018-08-31 NOTE — Progress Notes (Signed)
Patient: Alex Perkins Male    DOB: 02-Mar-1986   33 y.o.   MRN: [id number] Visit Date: 08/31/2018  Today's Provider: Megan Mans, MD   Chief Complaint  Patient presents with  . Ear Pain    right ear drainage this morning, left ear pain also   Subjective:     Otalgia   There is pain in the left ear. This is a new problem. The current episode started yesterday. The problem occurs hourly. The problem has been waxing and waning. There has been no fever. The pain is at a severity of 2/10. The pain is mild. Associated symptoms include ear discharge (right ear this morning 08/31/18). He has tried NSAIDs and cold packs for the symptoms. The treatment provided mild relief.  He thinks he has ongoing sinus issues that caused some of this lightheaded/dizzy spells.  This is not the same as his severe vertigo he came in with the past.   No Known Allergies   Current Outpatient Medications:  .  cetirizine (ZYRTEC) 10 MG tablet, Take 1 tablet (10 mg total) by mouth daily., Disp: 30 tablet, Rfl: 11 .  cyclobenzaprine (FLEXERIL) 10 MG tablet, cyclobenzaprine 10 mg tablet  TK 1 T PO Q 8 H PRF SPASM, Disp: , Rfl:  .  fluticasone (FLONASE) 50 MCG/ACT nasal spray, Place 2 sprays into both nostrils daily., Disp: 16 g, Rfl: 6 .  meclizine (ANTIVERT) 25 MG tablet, Take 1 tablet (25 mg total) by mouth every 6 (six) hours as needed for dizziness. (Patient not taking: Reported on 08/31/2018), Disp: 30 tablet, Rfl: 0 .  [START ON 09/29/2018] montelukast (SINGULAIR) 10 MG tablet, Take 1 tablet (10 mg total) by mouth at bedtime., Disp: 30 tablet, Rfl: 11 .  promethazine (PHENERGAN) 25 MG tablet, Take 1 tablet (25 mg total) by mouth every 6 (six) hours as needed for nausea or vomiting. (Patient not taking: Reported on 08/31/2018), Disp: 30 tablet, Rfl: 0  Review of Systems  Constitutional: Negative.   HENT: Positive for ear discharge (right ear this morning 08/31/18) and ear pain (left ear started  08/29/18).   Eyes: Negative.   Respiratory: Negative.   Cardiovascular: Negative.   Gastrointestinal: Negative.   Endocrine: Negative.   Genitourinary: Negative.   Musculoskeletal: Negative.   Skin: Negative.   Allergic/Immunologic: Negative.   Neurological: Positive for dizziness (mild).  Hematological: Negative.   Psychiatric/Behavioral: Negative.     Social History   Tobacco Use  . Smoking status: Former Smoker    Years: 2.00  . Smokeless tobacco: Never Used  Substance Use Topics  . Alcohol use: No    Alcohol/week: 0.0 standard drinks      Objective:   BP 120/78 (BP Location: Right Arm, Patient Position: Sitting, Cuff Size: Large)   Pulse 88   Temp 98.1 F (36.7 C) (Oral)   Ht 6\' 1"  (1.854 m)   Wt 292 lb 6.4 oz (132.6 kg)   SpO2 97%   BMI 38.58 kg/m  Vitals:   08/31/18 1332  BP: 120/78  Pulse: 88  Temp: 98.1 F (36.7 C)  TempSrc: Oral  SpO2: 97%  Weight: 292 lb 6.4 oz (132.6 kg)  Height: 6\' 1"  (1.854 m)     Physical Exam Vitals signs reviewed.  Constitutional:      Appearance: Normal appearance. He is normal weight.  HENT:     Head: Normocephalic and atraumatic.     Right Ear: Tympanic membrane, ear canal and external  ear normal.     Left Ear: Tympanic membrane, ear canal and external ear normal.     Ears:     Comments: TMs Full and dull.    Nose: Nose normal.  Eyes:     Extraocular Movements: Extraocular movements intact.     Conjunctiva/sclera: Conjunctivae normal.     Pupils: Pupils are equal, round, and reactive to light.  Neck:     Musculoskeletal: Normal range of motion and neck supple.  Cardiovascular:     Rate and Rhythm: Normal rate and regular rhythm.     Pulses: Normal pulses.     Heart sounds: Normal heart sounds.  Pulmonary:     Effort: Pulmonary effort is normal.     Breath sounds: Normal breath sounds.  Abdominal:     General: Bowel sounds are normal.  Musculoskeletal: Normal range of motion.  Neurological:     General: No  focal deficit present.     Mental Status: He is alert and oriented to person, place, and time. Mental status is at baseline.     Cranial Nerves: No cranial nerve deficit.     Sensory: No sensory deficit.     Motor: No weakness.     Coordination: Coordination normal.     Gait: Gait normal.     Comments: Completely nonfocal exam.  Psychiatric:        Mood and Affect: Mood normal.        Behavior: Behavior normal.        Thought Content: Thought content normal.        Judgment: Judgment normal.         Assessment & Plan    1. Allergic rhinitis, unspecified seasonality, unspecified trigger With eustachian tube dysfunction and serous otitis media.  Try Zyrtec and Flonase for a month.  If not improving add Singulair.  If not improving after that refer to ENT.  Antibiotics not indicated at all. - cetirizine (ZYRTEC) 10 MG tablet; Take 1 tablet (10 mg total) by mouth daily.  Dispense: 30 tablet; Refill: 11 - fluticasone (FLONASE) 50 MCG/ACT nasal spray; Place 2 sprays into both nostrils daily.  Dispense: 16 g; Refill: 6 - montelukast (SINGULAIR) 10 MG tablet; Take 1 tablet (10 mg total) by mouth at bedtime.  Dispense: 30 tablet; Refill: 11  2. Sleep apnea, unspecified type   3. Class 2 obesity due to excess calories without serious comorbidity with body mass index (BMI) of 38.0 to 38.9 in adult Discussed at length with patient.  Waist size  is the main issue that we have talked about.  As he is a fairly muscular man.  4. History of vertigo Dr. Sherryll Burger thought this was from vestibular migraine.  No recent occurrence.  See Dr. Clelia Croft if this recurs.  Never did get the MRI Dr. Sherryll Burger ordered because of cost.    I have done the exam and reviewed the above chart and it is accurate to the best of my knowledge. Dentist has been used in this note in any air is in the dictation or transcription are unintentional.  Megan Mans, MD  [facility]

## 2018-12-08 ENCOUNTER — Ambulatory Visit (INDEPENDENT_AMBULATORY_CARE_PROVIDER_SITE_OTHER): Payer: Medicaid Other | Admitting: Family Medicine

## 2018-12-08 ENCOUNTER — Encounter: Payer: Self-pay | Admitting: Family Medicine

## 2018-12-08 ENCOUNTER — Other Ambulatory Visit: Payer: Self-pay

## 2018-12-08 VITALS — BP 131/88 | HR 93 | Temp 98.4°F | Resp 16 | Wt 293.4 lb

## 2018-12-08 DIAGNOSIS — E119 Type 2 diabetes mellitus without complications: Secondary | ICD-10-CM

## 2018-12-08 DIAGNOSIS — E6609 Other obesity due to excess calories: Secondary | ICD-10-CM | POA: Diagnosis not present

## 2018-12-08 DIAGNOSIS — G43109 Migraine with aura, not intractable, without status migrainosus: Secondary | ICD-10-CM

## 2018-12-08 DIAGNOSIS — H814 Vertigo of central origin: Secondary | ICD-10-CM

## 2018-12-08 DIAGNOSIS — Z6838 Body mass index (BMI) 38.0-38.9, adult: Secondary | ICD-10-CM

## 2018-12-08 DIAGNOSIS — R42 Dizziness and giddiness: Secondary | ICD-10-CM | POA: Diagnosis not present

## 2018-12-08 LAB — POCT GLYCOSYLATED HEMOGLOBIN (HGB A1C): Hemoglobin A1C: 6.3 % — AB (ref 4.0–5.6)

## 2018-12-08 NOTE — Progress Notes (Signed)
Patient: Alex Perkins Male    DOB: 07/15/1985   33 y.o.   MRN: [id number] Visit Date: 12/08/2018  Today's Provider: Megan Mans, MD   Chief Complaint  Patient presents with  . Dizziness   Subjective:     Dizziness  This is a recurrent problem. The current episode started in the past 7 days. The problem occurs daily. The problem has been resolved. Associated symptoms include vertigo. Pertinent negatives include no abdominal pain, anorexia, arthralgias, change in bowel habit, chest pain, chills, congestion, coughing, diaphoresis, fatigue, fever, headaches, joint swelling, myalgias, nausea, neck pain, numbness, rash, sore throat, swollen glands, urinary symptoms, visual change, vomiting or weakness. Nothing aggravates the symptoms. He has tried NSAIDs (Meclizine) for the symptoms. The treatment provided significant relief.  He has no chest pain,syncope. When this happens he does c/o exertional "dizziness." Last fasting BS was 127.  No Known Allergies   Current Outpatient Medications:  .  cetirizine (ZYRTEC) 10 MG tablet, Take 1 tablet (10 mg total) by mouth daily., Disp: 30 tablet, Rfl: 11 .  fluticasone (FLONASE) 50 MCG/ACT nasal spray, Place 2 sprays into both nostrils daily., Disp: 16 g, Rfl: 6 .  cyclobenzaprine (FLEXERIL) 10 MG tablet, cyclobenzaprine 10 mg tablet  TK 1 T PO Q 8 H PRF SPASM, Disp: , Rfl:  .  meclizine (ANTIVERT) 25 MG tablet, Take 1 tablet (25 mg total) by mouth every 6 (six) hours as needed for dizziness. (Patient not taking: Reported on 08/31/2018), Disp: 30 tablet, Rfl: 0 .  montelukast (SINGULAIR) 10 MG tablet, Take 1 tablet (10 mg total) by mouth at bedtime. (Patient not taking: Reported on 12/08/2018), Disp: 30 tablet, Rfl: 11 .  promethazine (PHENERGAN) 25 MG tablet, Take 1 tablet (25 mg total) by mouth every 6 (six) hours as needed for nausea or vomiting. (Patient not taking: Reported on 08/31/2018), Disp: 30 tablet, Rfl: 0  Review of Systems   Constitutional: Negative for chills, diaphoresis, fatigue and fever.  HENT: Negative for congestion and sore throat.   Respiratory: Negative for cough.   Cardiovascular: Negative for chest pain.  Gastrointestinal: Negative for abdominal pain, anorexia, change in bowel habit, nausea and vomiting.  Musculoskeletal: Negative for arthralgias, joint swelling, myalgias and neck pain.  Skin: Negative for rash.  Neurological: Positive for dizziness and vertigo. Negative for weakness, numbness and headaches.    Social History   Tobacco Use  . Smoking status: Former Smoker    Years: 2.00  . Smokeless tobacco: Never Used  Substance Use Topics  . Alcohol use: No    Alcohol/week: 0.0 standard drinks      Objective:   BP 131/88   Pulse 93   Temp 98.4 F (36.9 C) (Oral)   Resp 16   Wt 293 lb 6.4 oz (133.1 kg)   BMI 38.71 kg/m  Vitals:   12/08/18 1349  BP: 131/88  Pulse: 93  Resp: 16  Temp: 98.4 F (36.9 C)  TempSrc: Oral  Weight: 293 lb 6.4 oz (133.1 kg)     Physical Exam Vitals signs reviewed.  Constitutional:      Appearance: Normal appearance. He is normal weight.  HENT:     Head: Normocephalic and atraumatic.     Right Ear: Tympanic membrane, ear canal and external ear normal.     Left Ear: Tympanic membrane, ear canal and external ear normal.     Nose: Nose normal.  Eyes:     Extraocular Movements: Extraocular movements  intact.     Conjunctiva/sclera: Conjunctivae normal.     Pupils: Pupils are equal, round, and reactive to light.  Neck:     Musculoskeletal: Normal range of motion and neck supple.     Vascular: No carotid bruit.  Cardiovascular:     Rate and Rhythm: Normal rate and regular rhythm.     Pulses: Normal pulses.     Heart sounds: Normal heart sounds.  Pulmonary:     Effort: Pulmonary effort is normal.     Breath sounds: Normal breath sounds.  Abdominal:     General: Bowel sounds are normal.  Musculoskeletal: Normal range of motion.  Skin:     General: Skin is warm and dry.  Neurological:     General: No focal deficit present.     Mental Status: He is alert and oriented to person, place, and time. Mental status is at baseline.     Cranial Nerves: No cranial nerve deficit.     Sensory: No sensory deficit.     Motor: No weakness.     Coordination: Coordination normal.     Gait: Gait normal.     Comments: Completely nonfocal exam.  Psychiatric:        Mood and Affect: Mood normal.        Behavior: Behavior normal.        Thought Content: Thought content normal.        Judgment: Judgment normal.         Assessment & Plan    1. Vertigo of central origin  - MR Brain W Wo Contrast; Future - POCT glycosylated hemoglobin (Hb A1C)  2. Dizziness Refer back to Dr Sherryll BurgerShah. - Ambulatory referral to Neurology - Ambulatory referral to Chronic Care Management Services - EKG 12-Lead  3. Migraine aura without headache Per Dr Sherryll BurgerShah.   4. Type 2 diabetes mellitus without complication, without long-term current use of insulin (HCC) New diagnosis. A1C today is 6.3 with last FBS 127.Refer to CCM for diabetic counseling. 5 Obesity RTC 3 months.     Wendelyn Breslow Jr, MD  [facility] I,Kathleen J Wolford,acting as a scribe for Megan Mansichard  Jr, MD.,have documented all relevant documentation on the behalf of Megan Mans  Jr, MD,as directed by  Megan Mansichard  Jr, MD while in the presence of Megan Mansichard  Jr, MD.  [facility]

## 2018-12-09 ENCOUNTER — Ambulatory Visit: Payer: Self-pay | Admitting: Pharmacist

## 2018-12-09 NOTE — Chronic Care Management (AMB) (Signed)
  Care Management   Note  12/09/2018 Name: [name] MRN: [id number] DOB: 11-13-1985  Alex Perkins is a 33 y.o. year old male who sees Maple Hudson., MD for primary care. Dr Sullivan Lone asked the CCM team to consult the patient for assistance with chronic disease management. Referral was placed 12/08/18. Telephone outreach to patient today to introduce care management services.    Alex Perkins was given information about Care Management services today including:  1. Case Management services includes personalized support from designated clinical staff supervised by his physician, including individualized plan of care and coordination with other care providers 2. 24/7 contact phone numbers for assistance for urgent and routine care needs. 3. The patient may stop case management services at any time by phone call to the office staff.  Plan: Patient did not agree to services and wishes to consider information provided before deciding about enrollment in care management services.    Alex Perkins wishes to ascertain the reason for his referral from Dr. Sullivan Lone (he suspects it has to do with prediabetes or diet) before meeting with Yvone Neu, RNCM.   Karalee Height, PharmD Clinical Pharmacist [facility] [phone]

## 2018-12-14 ENCOUNTER — Telehealth: Payer: Self-pay | Admitting: Family Medicine

## 2018-12-14 NOTE — Telephone Encounter (Signed)
Pt's insurance company states that ICD 10 code of H81.4 will need to be changed to different code in order to get MRI approved

## 2018-12-19 ENCOUNTER — Ambulatory Visit: Payer: Self-pay | Admitting: *Deleted

## 2018-12-19 NOTE — Telephone Encounter (Signed)
MRI approval might have to come through Dr. Manuella Ghazi from neurology.  I do not have anything else to add for MRI

## 2018-12-19 NOTE — Chronic Care Management (AMB) (Signed)
   Care Management   Note  Unsuccessful Call Note 12/19/2018 Name: Alex Perkins MRN: [id number] DOB: 12-04-1985  Alex Perkins is a 33 year old male who sees Dr, Gwynneth Albright for primary care. Dr. Rosanna Randy asked the CCM team to consult the patient for Diabetes education.  Referral was placed 12/08/18. Patient's last office visit was 12/08/18.     This social worker was unable to reach patient via telephone today to obtain consent to participate in the case management program. Per last CCM note, patient wanted to consider the information provided before deciding to enroll.  I have left HIPAA compliant voicemail asking patient to return my call. (unsuccessful outreach #1).   Plan: Will follow-up within 7 business days via telephone.      Elliot Gurney, [place] Worker  [place] [facility] Care [facility] [phone]

## 2018-12-29 ENCOUNTER — Ambulatory Visit: Payer: Self-pay | Admitting: Pharmacist

## 2018-12-29 ENCOUNTER — Telehealth: Payer: Self-pay | Admitting: Pharmacist

## 2018-12-29 NOTE — Chronic Care Management (AMB) (Signed)
  Care Management   Note  12/29/2018 Name: Alex Perkins MRN: [id number] DOB: Sep 04, 1985  Alex Perkins is a 33 y.o. year old male who sees Jerrol Banana., MD for primary care. Dr. Rosanna Randy asked the CCM team to consult the patient for assistance with chronic disease management related to prediabetes. Referral was placed 12/08/2018. Telephone outreach to patient today to introduce care management services.   Plan: [name] was given information about Care Management services today including:  1. Case Management services includes personalized support from designated clinical staff supervised by his physician, including individualized plan of care and coordination with other care providers 2. 24/7 contact phone numbers for assistance for urgent and routine care needs. 3. The patient may stop case management services at any time by phone call to the office staff.  Patient did not agree to enrollment in care management services and does not wish to consider at this time.   Ruben Reason, PharmD Clinical Pharmacist [place] [phone]

## 2018-12-29 NOTE — Telephone Encounter (Signed)
Please review. Thanks!  

## 2018-12-29 NOTE — Telephone Encounter (Signed)
We could not get it approved with normal exam. I think neurology can get it approved most likely

## 2018-12-29 NOTE — Telephone Encounter (Signed)
During care management call, patient states he has not heard from imaging.   In referral notes, it looks like referral was closed- due to ICD10 and maybe it needs to come from neurology? Patient is worried because he needs the MRI prior to appointment from neurology and neurology appointment is scheduled.   Patient requesting call back.   Ruben Reason, PharmD Clinical Pharmacist [place] [phone]

## 2018-12-29 NOTE — Patient Instructions (Signed)
Alex Perkins was given information about Care Management services today including:  1. Care Management services includes personalized support from designated clinical staff supervised by his physician, including individualized plan of care and coordination with other care providers 2. 24/7 contact phone numbers for assistance for urgent and routine care needs. 3. The patient may stop case management services at any time by phone call to the office staff.  Patient did not agree to enrollment in care management services and does not wish to consider at this time.

## 2018-12-30 NOTE — Telephone Encounter (Signed)
Tried calling patient and mailbox was full and unable to leave a message. Will try again later.

## 2019-01-04 ENCOUNTER — Encounter: Payer: Self-pay | Admitting: Family Medicine

## 2019-01-05 NOTE — Telephone Encounter (Signed)
Unable to contact the patient. Will save message to the chart.  

## 2019-01-09 NOTE — Telephone Encounter (Signed)
Please call patient at [phone] regarding the MRI that needs to be scheduled.  teri

## 2019-01-10 NOTE — Telephone Encounter (Signed)
Judson Roch, does this need another order? Please advise. Thanks!

## 2019-01-16 DIAGNOSIS — G43109 Migraine with aura, not intractable, without status migrainosus: Secondary | ICD-10-CM | POA: Diagnosis not present

## 2019-01-16 DIAGNOSIS — F411 Generalized anxiety disorder: Secondary | ICD-10-CM | POA: Diagnosis not present

## 2019-02-15 ENCOUNTER — Ambulatory Visit: Payer: Self-pay | Admitting: Pharmacist

## 2019-02-15 DIAGNOSIS — M542 Cervicalgia: Secondary | ICD-10-CM | POA: Diagnosis not present

## 2019-02-15 DIAGNOSIS — E538 Deficiency of other specified B group vitamins: Secondary | ICD-10-CM | POA: Diagnosis not present

## 2019-02-15 DIAGNOSIS — E559 Vitamin D deficiency, unspecified: Secondary | ICD-10-CM | POA: Diagnosis not present

## 2019-02-15 DIAGNOSIS — G43109 Migraine with aura, not intractable, without status migrainosus: Secondary | ICD-10-CM

## 2019-02-16 NOTE — Chronic Care Management (AMB) (Signed)
  Care Management   Note  02/16/2019 Name: FLEM ENDERLE MRN: [id number] DOB: 1985-12-07  Subjective: Mr. Tim Corriher left message on pharmacist phone, pharmacist returning call. HIPAA identifiers verified.  Mr. Dorris states he recently started sumatriptan for migraine headaches and had a few questions regarding the medication and how it worked. Wondered about any interaction with Excedrin migraine relief or promethezine.   Assessment: Patient educated on purpose, proper use and potential adverse effects of sumatriptan.     No further follow up required: Patient does not have CM goals  Ruben Reason, PharmD Clinical Pharmacist [place] [phone]

## 2019-02-22 DIAGNOSIS — G43109 Migraine with aura, not intractable, without status migrainosus: Secondary | ICD-10-CM | POA: Diagnosis not present

## 2019-02-22 DIAGNOSIS — F411 Generalized anxiety disorder: Secondary | ICD-10-CM | POA: Diagnosis not present

## 2019-02-27 ENCOUNTER — Ambulatory Visit: Payer: Self-pay | Admitting: Pharmacist

## 2019-02-27 DIAGNOSIS — H814 Vertigo of central origin: Secondary | ICD-10-CM

## 2019-02-27 DIAGNOSIS — G43109 Migraine with aura, not intractable, without status migrainosus: Secondary | ICD-10-CM

## 2019-02-28 ENCOUNTER — Ambulatory Visit (INDEPENDENT_AMBULATORY_CARE_PROVIDER_SITE_OTHER): Payer: Medicaid Other | Admitting: Family Medicine

## 2019-02-28 ENCOUNTER — Encounter: Payer: Self-pay | Admitting: Family Medicine

## 2019-02-28 ENCOUNTER — Other Ambulatory Visit: Payer: Self-pay

## 2019-02-28 VITALS — BP 123/83 | HR 63 | Temp 98.9°F | Resp 16 | Ht 73.0 in | Wt 267.0 lb

## 2019-02-28 DIAGNOSIS — I2510 Atherosclerotic heart disease of native coronary artery without angina pectoris: Secondary | ICD-10-CM | POA: Diagnosis not present

## 2019-02-28 DIAGNOSIS — E119 Type 2 diabetes mellitus without complications: Secondary | ICD-10-CM

## 2019-02-28 DIAGNOSIS — E785 Hyperlipidemia, unspecified: Secondary | ICD-10-CM | POA: Diagnosis not present

## 2019-02-28 NOTE — Progress Notes (Signed)
Patient: Alex Perkins Male    DOB: [birth year]   33 y.o.   MRN: [id number] Visit Date: 02/28/2019  Today's Provider: Wilhemena Durie, MD   Chief Complaint  Patient presents with  . Follow-up   Subjective:   HPI Patient comes in today to discuss medications. He reports that he had a reaction to a medication that was prescribed by Dr. Manuella Ghazi (Lexapro). He reports that he had chills, nausea, dizziness, and sweats. He reports that he no longer takes the medication. He wanted to get an opinion on something different he could take for his anxiety.  He also says drShah says he had carotid ASCVD/clacification of carotids. No other symptoms.  Patient has also increased his exercise and made lifestyle changes in his diet. He has lost 26lbs in 8 weeks.  Wt Readings from Last 3 Encounters:  02/28/19 267 lb (121.1 kg)  12/08/18 293 lb 6.4 oz (133.1 kg)  08/31/18 292 lb 6.4 oz (132.6 kg)   BP Readings from Last 3 Encounters:  02/28/19 123/83  12/08/18 131/88  08/31/18 120/78    No Known Allergies   Current Outpatient Medications:  .  cyclobenzaprine (FLEXERIL) 10 MG tablet, cyclobenzaprine 10 mg tablet  TK 1 T PO Q 8 H PRF SPASM, Disp: , Rfl:  .  cetirizine (ZYRTEC) 10 MG tablet, Take 1 tablet (10 mg total) by mouth daily. (Patient not taking: Reported on 02/28/2019), Disp: 30 tablet, Rfl: 11 .  fluticasone (FLONASE) 50 MCG/ACT nasal spray, Place 2 sprays into both nostrils daily. (Patient not taking: Reported on 02/28/2019), Disp: 16 g, Rfl: 6 .  meclizine (ANTIVERT) 25 MG tablet, Take 1 tablet (25 mg total) by mouth every 6 (six) hours as needed for dizziness. (Patient not taking: Reported on 08/31/2018), Disp: 30 tablet, Rfl: 0 .  montelukast (SINGULAIR) 10 MG tablet, Take 1 tablet (10 mg total) by mouth at bedtime. (Patient not taking: Reported on 12/08/2018), Disp: 30 tablet, Rfl: 11 .  promethazine (PHENERGAN) 25 MG tablet, Take 1 tablet (25 mg total) by mouth every 6 (six) hours  as needed for nausea or vomiting. (Patient not taking: Reported on 08/31/2018), Disp: 30 tablet, Rfl: 0  Review of Systems  Constitutional: Positive for activity change and fatigue.  HENT: Negative.   Eyes: Negative.   Respiratory: Negative for cough and shortness of breath.   Cardiovascular: Negative for chest pain, palpitations and leg swelling.  Endocrine: Negative.   Musculoskeletal: Negative for arthralgias and myalgias.  Allergic/Immunologic: Negative.   Neurological: Negative for dizziness, tremors, syncope, light-headedness and headaches.  Psychiatric/Behavioral: Negative for agitation, self-injury, sleep disturbance and suicidal ideas. The patient is nervous/anxious.     Social History   Tobacco Use  . Smoking status: Former Smoker    Years: 2.00  . Smokeless tobacco: Never Used  Substance Use Topics  . Alcohol use: No    Alcohol/week: 0.0 standard drinks      Objective:   BP 123/83   Pulse 63   Temp 98.9 F (37.2 C)   Resp 16   Ht 6\' 1"  (1.854 m)   Wt 267 lb (121.1 kg)   BMI 35.23 kg/m  Vitals:   02/28/19 1538  BP: 123/83  Pulse: 63  Resp: 16  Temp: 98.9 F (37.2 C)  Weight: 267 lb (121.1 kg)  Height: 6\' 1"  (1.854 m)     Physical Exam Vitals signs reviewed.  Constitutional:      Appearance: Normal appearance.  HENT:  Head: Normocephalic and atraumatic.     Right Ear: External ear normal.     Left Ear: External ear normal.  Eyes:     General: No scleral icterus. Cardiovascular:     Rate and Rhythm: Normal rate and regular rhythm.     Pulses: Normal pulses.     Heart sounds: Normal heart sounds.  Pulmonary:     Effort: Pulmonary effort is normal.     Breath sounds: Normal breath sounds.  Abdominal:     Palpations: Abdomen is soft.  Skin:    General: Skin is warm and dry.  Neurological:     General: No focal deficit present.     Mental Status: He is alert and oriented to person, place, and time.  Psychiatric:        Mood and Affect:  Mood normal.        Behavior: Behavior normal.        Thought Content: Thought content normal.        Judgment: Judgment normal.      No results found for any visits on 02/28/19.     Assessment & Plan    1. ASCVD (arteriosclerotic cardiovascular disease) Pt wishes CAD risk assessment.  - Ambulatory referral to Cardiology  2. Type 2 diabetes mellitus without complication, without long-term current use of insulin (HCC)  - Comprehensive metabolic panel  3. Hyperlipidemia, unspecified hyperlipidemia type Start statin--lipids 1 month and recheck. - Lipid panel     Megan Mansichard Gilbert Jr, MD  [facility]

## 2019-03-01 ENCOUNTER — Other Ambulatory Visit: Payer: Self-pay

## 2019-03-01 ENCOUNTER — Encounter: Payer: Self-pay | Admitting: Emergency Medicine

## 2019-03-01 ENCOUNTER — Emergency Department: Payer: Medicaid Other

## 2019-03-01 ENCOUNTER — Emergency Department
Admission: EM | Admit: 2019-03-01 | Discharge: 2019-03-01 | Disposition: A | Discharge status: Home / Self Care | Payer: Medicaid Other | Attending: Emergency Medicine | Admitting: Emergency Medicine

## 2019-03-01 DIAGNOSIS — R0789 Other chest pain: Secondary | ICD-10-CM | POA: Insufficient documentation

## 2019-03-01 DIAGNOSIS — R079 Chest pain, unspecified: Secondary | ICD-10-CM | POA: Diagnosis not present

## 2019-03-01 DIAGNOSIS — I2510 Atherosclerotic heart disease of native coronary artery without angina pectoris: Secondary | ICD-10-CM | POA: Insufficient documentation

## 2019-03-01 DIAGNOSIS — E785 Hyperlipidemia, unspecified: Secondary | ICD-10-CM | POA: Diagnosis not present

## 2019-03-01 DIAGNOSIS — E119 Type 2 diabetes mellitus without complications: Secondary | ICD-10-CM | POA: Diagnosis not present

## 2019-03-01 DIAGNOSIS — Z79899 Other long term (current) drug therapy: Secondary | ICD-10-CM | POA: Insufficient documentation

## 2019-03-01 LAB — CBC
HCT: 49.5 % (ref 39.0–52.0)
Hemoglobin: 16.4 g/dL (ref 13.0–17.0)
MCH: 29.1 pg (ref 26.0–34.0)
MCHC: 33.1 g/dL (ref 30.0–36.0)
MCV: 87.9 fL (ref 80.0–100.0)
Platelets: 283 10*3/uL (ref 150–400)
RBC: 5.63 MIL/uL (ref 4.22–5.81)
RDW: 12.5 % (ref 11.5–15.5)
WBC: 7.5 10*3/uL (ref 4.0–10.5)
nRBC: 0 % (ref 0.0–0.2)

## 2019-03-01 LAB — BASIC METABOLIC PANEL
Anion gap: 8 (ref 5–15)
BUN: 14 mg/dL (ref 6–20)
CO2: 22 mmol/L (ref 22–32)
Calcium: 9.4 mg/dL (ref 8.9–10.3)
Chloride: 107 mmol/L (ref 98–111)
Creatinine, Ser: 0.82 mg/dL (ref 0.61–1.24)
GFR calc Af Amer: >60 mL/min (ref >60)
GFR calc non Af Amer: >60 mL/min (ref >60)
Glucose, Bld: 108 mg/dL — ABNORMAL HIGH (ref 70–99)
Potassium: 4.2 mmol/L (ref 3.5–5.1)
Sodium: 137 mmol/L (ref 135–145)

## 2019-03-01 LAB — TROPONIN I (HIGH SENSITIVITY)
Troponin I (High Sensitivity): <2 ng/L (ref <18)
Troponin I (High Sensitivity): 2 ng/L (ref <18)

## 2019-03-01 NOTE — ED Provider Notes (Signed)
[facility] Emergency Department Provider Note  Time seen: 2:36 PM  I have reviewed the triage vital signs and the nursing notes.   HISTORY  Chief Complaint Chest Pain   HPI Alex Perkins is a 33 y.o. male with a past medical history of prediabetes, obesity, migraines, presents to the emergency department for chest pain.  According to the patient approximately 2 weeks ago he started Lexapro for migraines prescribed by his neurologist.  States he was experiencing side effects such as tremors and sweating especially first upon awakening.  Patient states he was told by his neurologist to stop this medication, he stopped it on Monday and states his symptoms have improve tremendously.  Patient states he went to donate blood this morning, around 10:00 after donating blood he developed a pain in his left chest that seem to go into his left neck.  York Spaniel it felt like a "cramping" pain in his chest.  States the pain went away right after that has not returned however patient states his doctor recently diagnosed him with coronary artery disease and had referred him to a cardiologist for a stress test.  Patient was concerned given his chest pain and recent diagnosis of coronary artery disease so he came to the emergency department for evaluation.  Here the patient denies any symptoms.  Denies any chest pain or shortness of breath.  Denies any recent fever cough congestion.   Past Medical History:  Diagnosis Date  . Pre-diabetes     Patient Active Problem List   Diagnosis Date Noted  . Sleep apnea 04/27/2016  . DDD (degenerative disc disease), lumbar 01/04/2015  . Clinical depression 01/04/2015  . Acid reflux 01/04/2015  . Adiposity 01/04/2015    History reviewed. No pertinent surgical history.  Prior to Admission medications   Medication Sig Start Date End Date Taking? Authorizing Provider  cetirizine (ZYRTEC) 10 MG tablet Take 1 tablet (10 mg total) by mouth  daily. Patient not taking: Reported on 02/28/2019 08/31/18   Maple Hudson., MD  cyclobenzaprine (FLEXERIL) 10 MG tablet cyclobenzaprine 10 mg tablet  TK 1 T PO Q 8 H PRF SPASM    [provider]  fluticasone (FLONASE) 50 MCG/ACT nasal spray Place 2 sprays into both nostrils daily. Patient not taking: Reported on 02/28/2019 08/31/18   Maple Hudson., MD  meclizine (ANTIVERT) 25 MG tablet Take 1 tablet (25 mg total) by mouth every 6 (six) hours as needed for dizziness. Patient not taking: Reported on 08/31/2018 07/04/18   Anola Gurney, PA  montelukast (SINGULAIR) 10 MG tablet Take 1 tablet (10 mg total) by mouth at bedtime. Patient not taking: Reported on 12/08/2018 09/29/18   Maple Hudson., MD  promethazine (PHENERGAN) 25 MG tablet Take 1 tablet (25 mg total) by mouth every 6 (six) hours as needed for nausea or vomiting. Patient not taking: Reported on 08/31/2018 04/28/18   Maple Hudson., MD    Allergies  Allergen Reactions  . Lexapro [Escitalopram Oxalate]     Shaking, chills    Family History  Problem Relation Age of Onset  . Diabetes Paternal Grandmother   . Heart attack Paternal Grandfather     Social History Social History   Tobacco Use  . Smoking status: Former Smoker    Years: 2.00  . Smokeless tobacco: Never Used  Substance Use Topics  . Alcohol use: No    Alcohol/week: 0.0 standard drinks  . Drug use: No  Comment: marijuana in high school    Review of Systems Constitutional: Negative for fever ENT: Negative for recent illness/congestion Cardiovascular: Positive for chest pain, now resolved. Respiratory: Negative for shortness of breath. Gastrointestinal: Negative for abdominal pain, vomiting  Musculoskeletal: Negative for musculoskeletal complaints Skin: Negative for skin complaints  Neurological: Negative for headache All other ROS negative  ____________________________________________   PHYSICAL EXAM:  VITAL  SIGNS: ED Triage Vitals  Enc Vitals Group     BP 03/01/19 1145 119/77     Pulse Rate 03/01/19 1145 74     Resp 03/01/19 1145 18     Temp 03/01/19 1145 98.6 F (37 C)     Temp Source 03/01/19 1145 Oral     SpO2 03/01/19 1145 95 %     Weight --      Height --      Head Circumference --      Peak Flow --      Pain Score 03/01/19 1147 0     Pain Loc --      Pain Edu? --      Excl. in [place]? --    Constitutional: Alert and oriented. Well appearing and in no distress. Eyes: Normal exam ENT      Head: Normocephalic and atraumatic.      Mouth/Throat: Mucous membranes are moist. Cardiovascular: Normal rate, regular rhythm.  Respiratory: Normal respiratory effort without tachypnea nor retractions. Breath sounds are clear Gastrointestinal: Soft and nontender. No distention.   Musculoskeletal: Nontender with normal range of motion in all extremities.  Neurologic:  Normal speech and language. No gross focal neurologic deficits  Skin:  Skin is warm, dry and intact.  Psychiatric: Mood and affect are normal.  ____________________________________________    EKG  EKG viewed and interpreted by myself shows a normal sinus rhythm at 100 bpm with a narrow QRS, mild right axis deviation, largely normal intervals, nonspecific ST changes.  ____________________________________________    RADIOLOGY  Chest x-ray negative  ____________________________________________   INITIAL IMPRESSION / ASSESSMENT AND PLAN / ED COURSE  Pertinent labs & imaging results that were available during my care of the patient were reviewed by me and considered in my medical decision making (see chart for details).   Patient presents to the emergency department for left-sided chest pain which he describes as more as a cramping sensation in his chest which resolved after several minutes and has remained resolved.  Differential would include ACS, angina, musculoskeletal pain.  We will check labs to continue to closely  monitor.  Physical exam is reassuring.  Patient's labs are overall reassuring, negative troponin, no acute findings on EKG and a normal chest x-ray.  We will repeat a troponin as a precaution.  If the patient's repeat troponin is negative anticipate likely discharge home.  Patient already has a PCP with good follow-up and has already been referred to a cardiologist for a stress test.  Repeat troponin remains negative.  Patient will be discharged with PCP/cardiology follow-up as scheduled.  Provided my normal chest pain return precautions.  Avian A Truex was evaluated in Emergency Department on 03/01/2019 for the symptoms described in the history of present illness. He was evaluated in the context of the global COVID-19 pandemic, which necessitated consideration that the patient might be at risk for infection with the SARS-CoV-2 virus that causes COVID-19. Institutional protocols and algorithms that pertain to the evaluation of patients at risk for COVID-19 are in a state of rapid change based on information released by regulatory  bodies including the CDC and federal and state organizations. These policies and algorithms were followed during the patient's care in the ED.  ____________________________________________   FINAL CLINICAL IMPRESSION(S) / ED DIAGNOSES  Chest pain   Minna AntisPaduchowski, Nyeli Holtmeyer, MD 03/01/19 1451

## 2019-03-01 NOTE — ED Triage Notes (Signed)
Pt reports he started Lexapro last week. Began to have chills and shaking on Friday. Went to neurologist who prescribed medication yesterday and was told to stop medication (which he did). Pt began to have intermittent left CP and jaw pain yesterday after stopping medication. No SOB. Some lightheadedness when he climbs stairs.  Also reports he has recently been diagnosed with Carotid artery disease.

## 2019-03-02 LAB — COMPREHENSIVE METABOLIC PANEL
ALT: 34 IU/L (ref 0–44)
AST: 20 IU/L (ref 0–40)
Albumin/Globulin Ratio: 1.9 (ref 1.2–2.2)
Albumin: 4.6 g/dL (ref 4.0–5.0)
Alkaline Phosphatase: 73 IU/L (ref 39–117)
BUN/Creatinine Ratio: 12 (ref 9–20)
BUN: 12 mg/dL (ref 6–20)
Bilirubin Total: 0.8 mg/dL (ref 0.0–1.2)
CO2: 21 mmol/L (ref 20–29)
Calcium: 9.9 mg/dL (ref 8.7–10.2)
Chloride: 101 mmol/L (ref 96–106)
Creatinine, Ser: 1.03 mg/dL (ref 0.76–1.27)
GFR calc Af Amer: 108 mL/min/{1.73_m2} (ref >59)
GFR calc non Af Amer: 94 mL/min/{1.73_m2} (ref >59)
Globulin, Total: 2.4 g/dL (ref 1.5–4.5)
Glucose: 106 mg/dL — ABNORMAL HIGH (ref 65–99)
Potassium: 4.6 mmol/L (ref 3.5–5.2)
Sodium: 139 mmol/L (ref 134–144)
Total Protein: 7 g/dL (ref 6.0–8.5)

## 2019-03-02 LAB — LIPID PANEL
Chol/HDL Ratio: 6.6 ratio — ABNORMAL HIGH (ref 0.0–5.0)
Cholesterol, Total: 224 mg/dL — ABNORMAL HIGH (ref 100–199)
HDL: 34 mg/dL — ABNORMAL LOW (ref >39)
LDL Calculated: 155 mg/dL — ABNORMAL HIGH (ref 0–99)
Triglycerides: 176 mg/dL — ABNORMAL HIGH (ref 0–149)
VLDL Cholesterol Cal: 35 mg/dL (ref 5–40)

## 2019-03-02 NOTE — Chronic Care Management (AMB) (Signed)
  Care Management   Note  03/02/2019- LATE ENTRY Name: Alex Perkins MRN: [id number] DOB: 07/20/1985  Subjective: [name] calling to speak with pharmacist regarding concerns over medication initiated by Dr. Manuella Ghazi. HIPAA identifiers verified.  Alex Perkins states he recently started sumatriptan for migraine headaches as well as Lexapro (escitalopram) and was having significant side effects: tingling in hands and feet, night sweats, "mind shakes", nausea. Side effects started approximately Friday, began Lexapro just over 1 week prior to that. Prescribed by Dr. Manuella Ghazi, has called Dr. Trena Platt office but has not heard a response office. Concerned he is experiencing serotonin syndrome (when combined with sumatriptan, has taken 4 sumatriptan doses in the past 10 days).    Assessment: Patient educated side effects of escitalopram. Night sweats, nausea though rare, can happen. Recommend he DC medication today. No need to taper off of escitalpram though it is an SSRI because he has only been taking for 10 days.   Counseled patient that it is extremely unlikely he is suffering from serotonin syndrome. This condition is incredibly rare and is unlikely to occur with a PRN medication such as sumatriptan. It is also likely to have progressed more rapidly by now if he has been experiencing such symptoms since Friday. If he is concerned, consider asking Dr. Manuella Ghazi for labs, but I have consulted with Dr. Rosanna Randy and we both agree that it is incredibly unlikely. Far more likely he is having a very bad reaction to escitalopram combined with anxiety from reading about side effects on the internet. Patient does deny other drug use including amphetamine and opiods, both prescription and nonprescription.   I am concerned by what patient is describing as "mind shakes". Encourage patient to follow up with Dr. Manuella Ghazi as this is not a known side effect of escitalopram and could be a seizure. Caution patient that this is outside  of my area and he needs to be assessed by Dr. Manuella Ghazi.   Plan No further follow up required: Patient does not have CM goals  Ruben Reason, PharmD Clinical Pharmacist [place] [phone]

## 2019-03-03 ENCOUNTER — Telehealth: Payer: Self-pay

## 2019-03-03 MED ORDER — ROSUVASTATIN CALCIUM 10 MG PO TABS: 10.0000 mg | ORAL_TABLET | Freq: Every day | ORAL | 12 refills | Status: DC

## 2019-03-03 NOTE — Telephone Encounter (Signed)
-----   Message from Jerrol Banana., MD sent at 03/02/2019  8:45 AM EDT ----- With recent results would start Rosuvastatin 10mg  nightly. Will retest labs on next visit.

## 2019-03-03 NOTE — Telephone Encounter (Signed)
Patient advised and Rx sent to pharmacy 

## 2019-03-03 NOTE — Telephone Encounter (Signed)
Tried calling patient and no answer. VMbox full and unable to leave a message. Will try again later.  

## 2019-03-15 ENCOUNTER — Ambulatory Visit: Payer: Self-pay | Admitting: Family Medicine

## 2019-06-12 ENCOUNTER — Telehealth: Payer: Self-pay | Admitting: Family Medicine

## 2019-06-12 NOTE — Telephone Encounter (Signed)
Which medication does pt need refilled?

## 2019-06-19 ENCOUNTER — Other Ambulatory Visit: Payer: Self-pay | Admitting: Family Medicine

## 2019-06-20 MED ORDER — PROMETHAZINE HCL 25 MG PO TABS: 25.0000 mg | ORAL_TABLET | Freq: Four times a day (QID) | ORAL | 3 refills | Status: DC | PRN

## 2019-08-04 ENCOUNTER — Ambulatory Visit: Payer: Self-pay

## 2019-08-04 DIAGNOSIS — R7303 Prediabetes: Secondary | ICD-10-CM

## 2019-08-04 NOTE — Telephone Encounter (Signed)
We could just order an A1c for him that he can do without appt then have him f/u about it with Dr. Reece Perkins about result. He could come in today for the A1c.

## 2019-08-04 NOTE — Telephone Encounter (Signed)
Miche or [place], although the glucose is not extremely high, patient is apparently worried.  Is this something you could work in sooner than what they said Dr Reece Agar could do

## 2019-08-04 NOTE — Telephone Encounter (Signed)
I spoke to the patient at length.  He was not really wanting anything to be done right away, he was just looking for some answers and advise.  Apparently his diet had changed recently and he has some changes in activity etc.  He has started back watching his diet and logging his glucose.  He would like to work on it for a while and then get the A1C checked.   I advised him that this would be ok as his reading were not alarming just concerning and for him to continue to watch what he does and he will continue to become more aware of what affects the readings.

## 2019-08-04 NOTE — Telephone Encounter (Signed)
Pt. Is not diabetic, but is concerned about his fasting blood sugars running 130-150. No symptoms. Reports recently he has more sugar in his diet. Concerned "because Dr. Sullivan Lone told me I was pre-diabetic." Requests a virtual visit. No availability until 08/12/19. Pt. Would like a visit sooner. Please advise pt. Contact number [phone].  Reason for Disposition . New onset diabetes suspected (e.g., frequent urination, weakness, weight loss)  Answer Assessment - Initial Assessment Questions 1. BLOOD GLUCOSE: "What is your blood glucose level?"      Fasting 130-150 2. ONSET: "When did you check the blood glucose?"     Checks it different times of the day. 3. USUAL RANGE: "What is your glucose level usually?" (e.g., usual fasting morning value, usual evening value)     120 4. KETONES: "Do you check for ketones (urine or blood test strips)?" If yes, ask: "What does the test show now?"      No 5. TYPE 1 or 2:  "Do you know what type of diabetes you have?"  (e.g., Type 1, Type 2, Gestational; doesn't know)      Not diagnosed 6. INSULIN: "Do you take insulin?" "What type of insulin(s) do you use? What is the mode of delivery? (syringe, pen; injection or pump)?"      No 7. DIABETES PILLS: "Do you take any pills for your diabetes?" If yes, ask: "Have you missed taking any pills recently?"     No diabetic pills 8. OTHER SYMPTOMS: "Do you have any symptoms?" (e.g., fever, frequent urination, difficulty breathing, dizziness, weakness, vomiting)     No 9. PREGNANCY: "Is there any chance you are pregnant?" "When was your last menstrual period?"     N/A  Protocols used: DIABETES - HIGH BLOOD SUGAR-A-AH

## 2019-09-30 IMAGING — CR CHEST - 2 VIEW
1 series · 2 of 2 positions shown · non-contrast
Comparison: None.

CLINICAL DATA: Left-sided chest pain

EXAM:
CHEST - 2 VIEW

[Series 1: dg chest 2 view · 0.14mm/px · 2 of 2 slices shown]
[im 1/2]
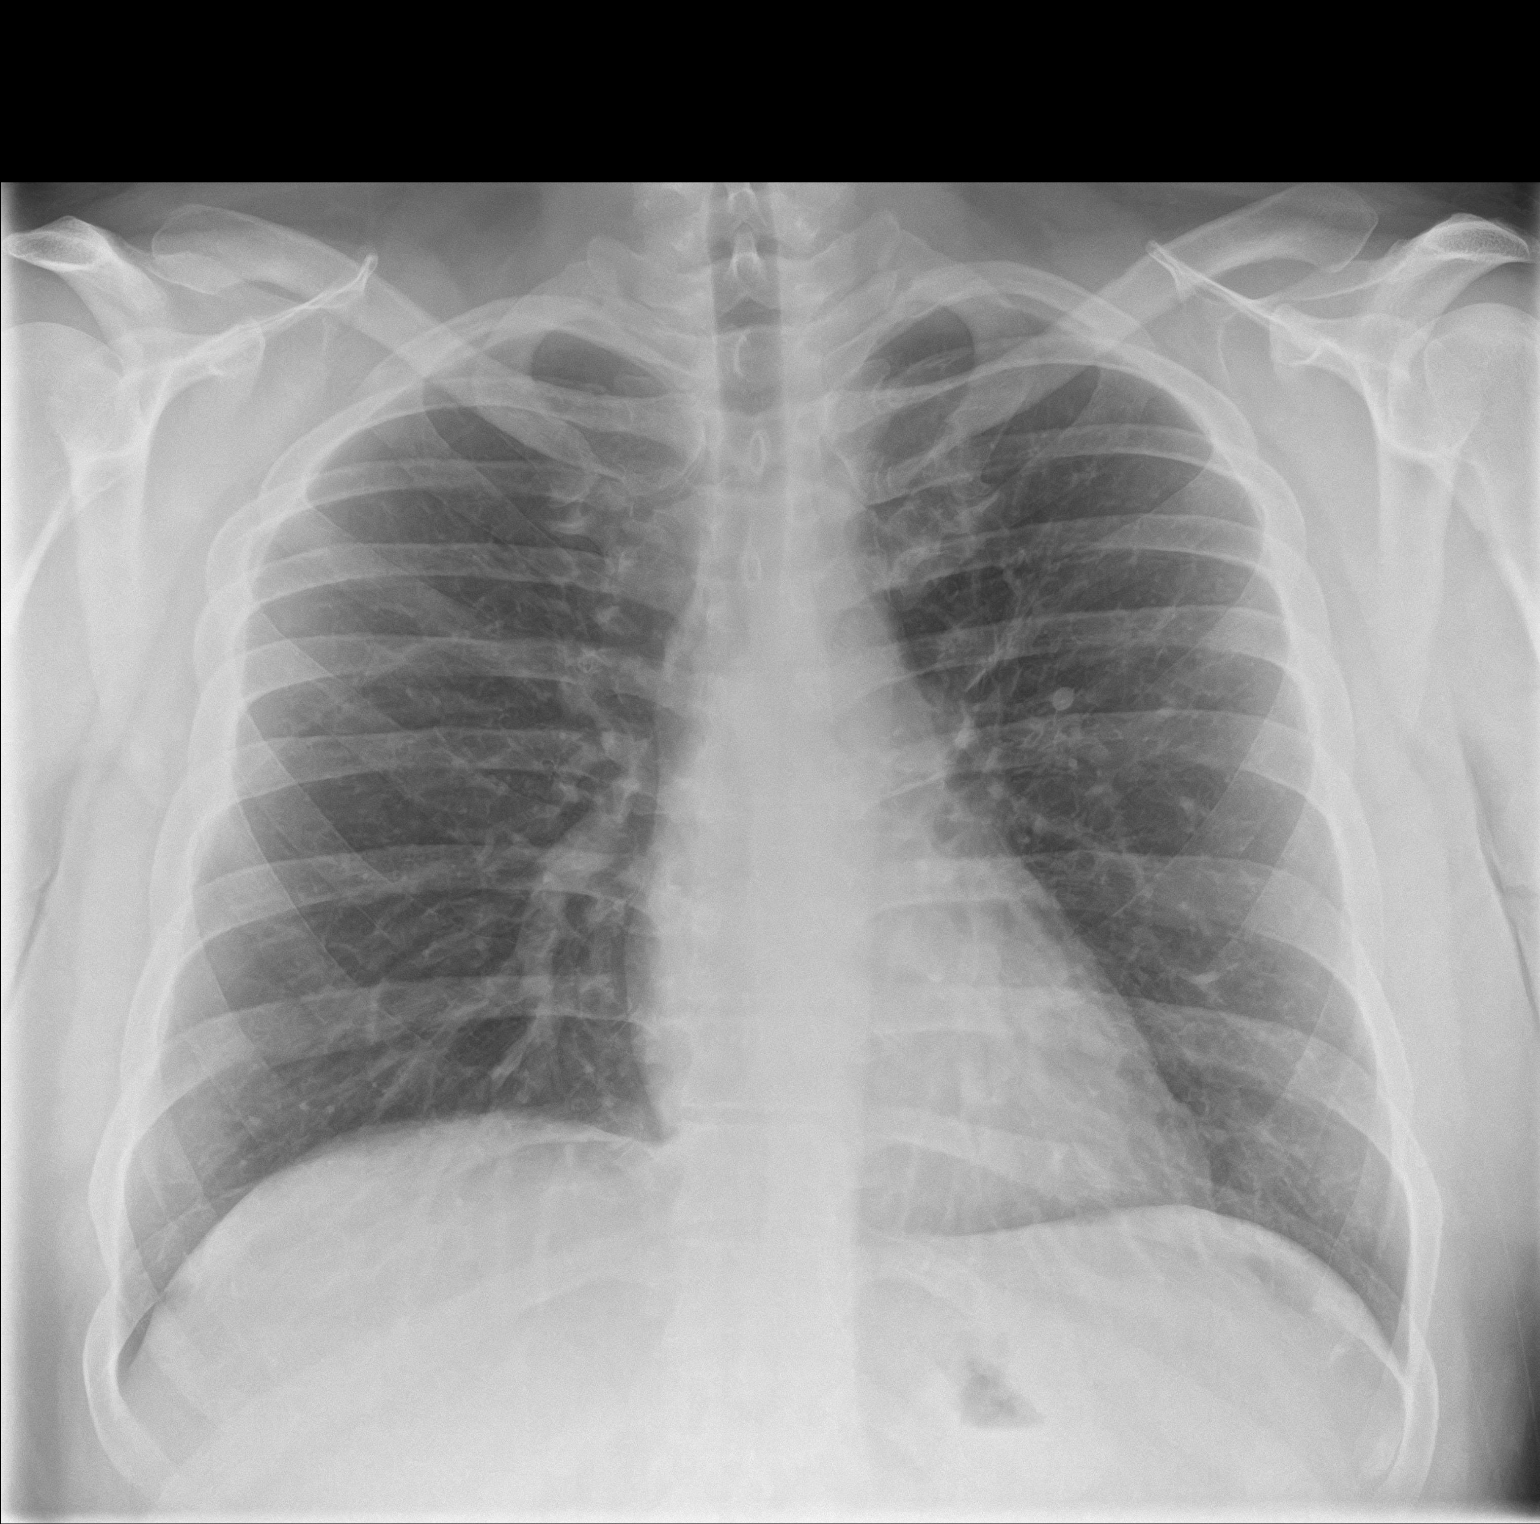
[im 2/2]
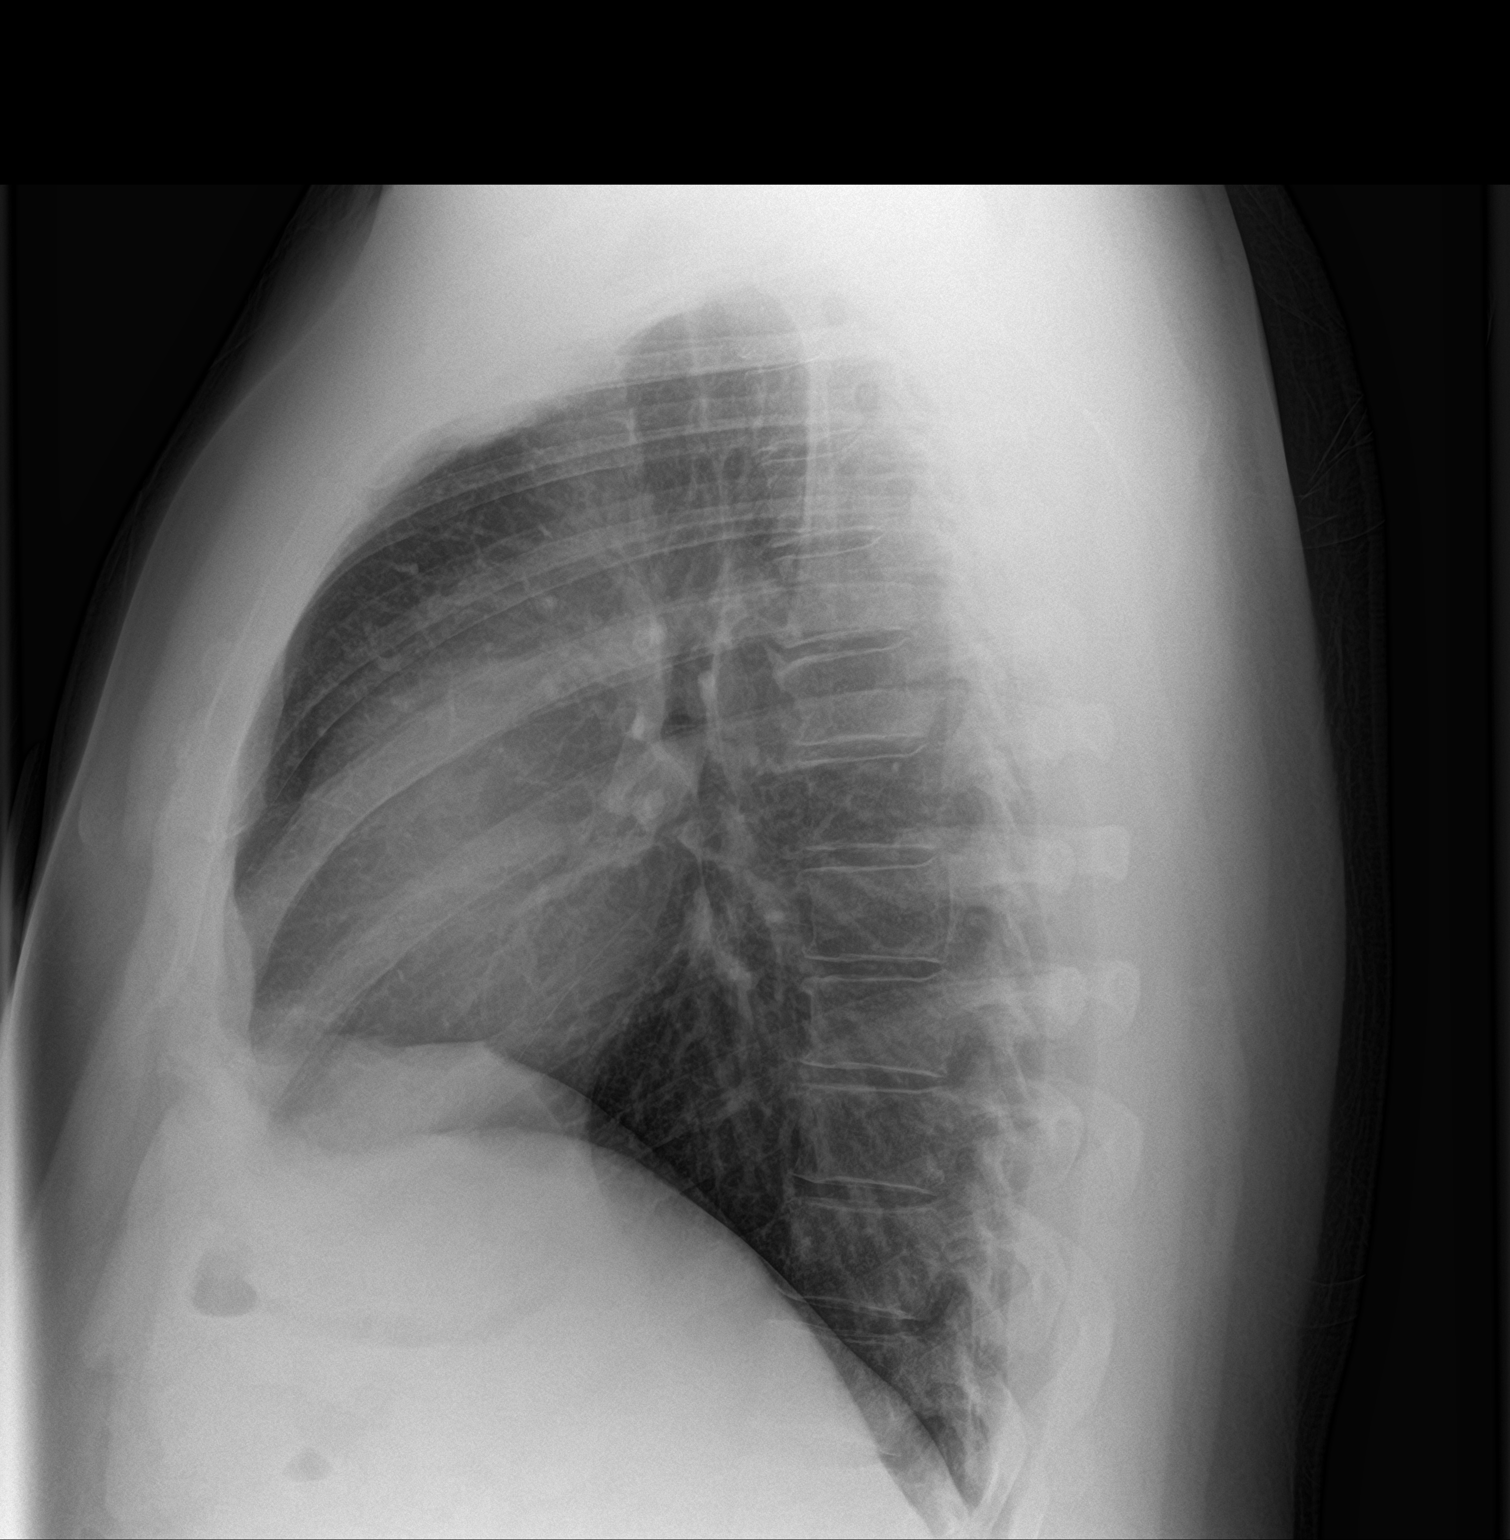

[2 of 2 positions shown; findings below may reference images not displayed]

FINDINGS: The heart size and mediastinal contours are within normal limits.
Both lungs are clear. The visualized skeletal structures are
unremarkable.
IMPRESSION: No acute abnormality of the lungs.

## 2020-03-20 NOTE — Progress Notes (Deleted)
     Established patient visit   Patient: Alex Perkins   DOB: 27-Oct-1985   34 y.o. Male  MRN: [id number] Visit Date: 03/21/2020  Today's healthcare provider: Megan Mans, MD   No chief complaint on file.  Subjective    HPI  ***  {Show patient history (optional):23778::" "}   Medications: Outpatient Medications Prior to Visit  Medication Sig  . cetirizine (ZYRTEC) 10 MG tablet Take 1 tablet (10 mg total) by mouth daily. (Patient not taking: Reported on 02/28/2019)  . cyclobenzaprine (FLEXERIL) 10 MG tablet cyclobenzaprine 10 mg tablet  TK 1 T PO Q 8 H PRF SPASM  . fluticasone (FLONASE) 50 MCG/ACT nasal spray Place 2 sprays into both nostrils daily. (Patient not taking: Reported on 02/28/2019)  . meclizine (ANTIVERT) 25 MG tablet Take 1 tablet (25 mg total) by mouth every 6 (six) hours as needed for dizziness. (Patient not taking: Reported on 08/31/2018)  . montelukast (SINGULAIR) 10 MG tablet Take 1 tablet (10 mg total) by mouth at bedtime. (Patient not taking: Reported on 12/08/2018)  . promethazine (PHENERGAN) 25 MG tablet Take 1 tablet (25 mg total) by mouth every 6 (six) hours as needed for nausea or vomiting.  . rosuvastatin (CRESTOR) 10 MG tablet Take 1 tablet (10 mg total) by mouth daily.   No facility-administered medications prior to visit.    Review of Systems  {Heme  Chem  Endocrine  Serology  Results Review (optional):23779::" "}  Objective    There were no vitals taken for this visit. {Show previous vital signs (optional):23777::" "}  Physical Exam  ***  No results found for any visits on 03/21/20.  Assessment & Plan     ***  No follow-ups on file.      {provider attestation***:1}   Megan Mans, MD  [facility] [phone] (phone) [phone] (fax)  [facility]

## 2020-03-21 ENCOUNTER — Ambulatory Visit: Payer: Medicaid Other | Admitting: Family Medicine

## 2020-03-22 ENCOUNTER — Other Ambulatory Visit: Payer: Self-pay | Admitting: Family Medicine

## 2020-03-22 DIAGNOSIS — G43109 Migraine with aura, not intractable, without status migrainosus: Secondary | ICD-10-CM

## 2020-03-22 NOTE — Telephone Encounter (Signed)
Requested medication (s) are due for refill today: yes   Requested medication (s) are on the active medication list: yes  Last refill:  06/10/2019  Future visit scheduled: no  Notes to clinic: Patient had appointment yesterday that was canceled  Please advise    Requested Prescriptions  Pending Prescriptions Disp Refills   SUMAtriptan (IMITREX) 100 MG tablet [Pharmacy Med Name: SUMATRIPTAN 100MG  TABLETS] 18 tablet     Sig: TAKE 1 TABLET BY MOUTH ONCE AS NEEDED FOR MIGRAINE FOR UP TO 1 DOSE. MAY TAKE A SECOND DOSE AFTER 2 HOURS IF NEEDED      Neurology:  Migraine Therapy - Triptan Failed - 03/22/2020 11:05 AM      Failed - Valid encounter within last 12 months    Recent Outpatient Visits           1 year ago ASCVD (arteriosclerotic cardiovascular disease)   [facility] 03/24/2020., MD   1 year ago Dizziness   [facility] MD   1 year ago Allergic rhinitis, unspecified seasonality, unspecified trigger   [facility] MD   1 year ago Vertigo   [facility] MD   2 years ago Sore throat   [facility] MD              Passed - Last BP in normal range    BP Readings from Last 1 Encounters:  03/01/19 121/64            SUMAtriptan (IMITREX) 100 MG tablet [Pharmacy Med Name: SUMATRIPTAN 100MG  TABLETS] 18 tablet     Sig: TAKE 1 TABLET BY MOUTH ONCE AS NEEDED FOR MIGRAINE FOR UP TO 1 DOSE. MAY TAKE A SECOND DOSE AFTER 2 HOURS IF NEEDED      Neurology:  Migraine Therapy - Triptan Failed - 03/22/2020 11:05 AM      Failed - Valid encounter within last 12 months    Recent Outpatient Visits           1 year ago ASCVD (arteriosclerotic cardiovascular disease)   [facility] 03/24/2020., MD   1 year ago Dizziness   [facility] Maple Hudson., MD   1 year ago  Allergic rhinitis, unspecified seasonality, unspecified trigger   [facility] Maple Hudson., MD   1 year ago Vertigo   [facility] Maple Hudson., MD   2 years ago Sore throat   [facility] Maple Hudson., MD              Passed - Last BP in normal range    BP Readings from Last 1 Encounters:  03/01/19 121/64

## 2020-07-30 ENCOUNTER — Other Ambulatory Visit: Payer: Self-pay | Admitting: Family Medicine

## 2020-08-01 NOTE — Telephone Encounter (Signed)
Pt calling in regarding this prescription. Please advise.

## 2020-12-25 DIAGNOSIS — S39012A Strain of muscle, fascia and tendon of lower back, initial encounter: Secondary | ICD-10-CM | POA: Diagnosis not present

## 2020-12-25 DIAGNOSIS — M5416 Radiculopathy, lumbar region: Secondary | ICD-10-CM | POA: Diagnosis not present

## 2020-12-25 DIAGNOSIS — M545 Low back pain, unspecified: Secondary | ICD-10-CM | POA: Diagnosis not present

## 2021-02-25 ENCOUNTER — Other Ambulatory Visit: Payer: Self-pay | Admitting: Family Medicine

## 2021-02-25 DIAGNOSIS — G43109 Migraine with aura, not intractable, without status migrainosus: Secondary | ICD-10-CM

## 2021-02-25 NOTE — Telephone Encounter (Signed)
Patient called, left VM to return the call to the office to schedule a visit. Last OV 02/2019 with 2 cancellations, unable to refill.

## 2021-02-25 NOTE — Telephone Encounter (Signed)
Requested medication (s) are due for refill today:Yes  Requested medication (s) are on the active medication list:Yes  Last refill:  6 months, 11 months, 1 year ago  Future visit scheduled: No  Notes to clinic:  Unable to refill per protocol, cannot delegate, office visit needed, message left to call office.     Requested Prescriptions  Pending Prescriptions Disp Refills   promethazine (PHENERGAN) 25 MG tablet 30 tablet 3    Sig: Take 1 tablet (25 mg total) by mouth every 6 (six) hours as needed for nausea or vomiting.     Not Delegated - Gastroenterology: Antiemetics Failed - 02/25/2021 12:25 PM      Failed - This refill cannot be delegated      Failed - Valid encounter within last 6 months    Recent Outpatient Visits           1 year ago ASCVD (arteriosclerotic cardiovascular disease)   [facility] Maple Hudson., MD   2 years ago Dizziness   [facility] Maple Hudson., MD   2 years ago Allergic rhinitis, unspecified seasonality, unspecified trigger   [facility] Maple Hudson., MD   2 years ago Vertigo   [facility] Maple Hudson., MD   3 years ago Sore throat   [facility] Maple Hudson[facility] MD               SUMAtriptan (IMITREX) 100 MG tablet 18 tablet 0    Sig: May repeat in 2 hours if headache persists or recurs.     Neurology:  Migraine Therapy - Triptan Failed - 02/25/2021 12:25 PM      Failed - Valid encounter within last 12 months    Recent Outpatient Visits           1 year ago ASCVD (arteriosclerotic cardiovascular disease)   [facility] Maple Hudson., MD   2 years ago Dizziness   [facility] Maple Hudson., MD   2 years ago Allergic rhinitis, unspecified seasonality, unspecified trigger   [facility] Maple Hudson., MD   2 years ago Vertigo   [facility] Maple Hudson., MD   3 years ago Sore throat   [facility] Maple Hudson., MD              Passed - Last BP in normal range    BP Readings from Last 1 Encounters:  03/01/19 121/64           meclizine (ANTIVERT) 25 MG tablet 30 tablet 0     Not Delegated - Gastroenterology: Antiemetics Failed - 02/25/2021 12:25 PM      Failed - This refill cannot be delegated      Failed - Valid encounter within last 6 months    Recent Outpatient Visits           1 year ago ASCVD (arteriosclerotic cardiovascular disease)   [facility] Maple Hudson., MD   2 years ago Dizziness   [facility] Maple Hudson., MD   2 years ago Allergic rhinitis, unspecified seasonality, unspecified trigger   [facility] Maple Hudson., MD   2 years ago Vertigo   [facility] Maple Hudson., MD   3 years ago Sore throat   [facility] Maple Hudson., MD

## 2021-02-25 NOTE — Telephone Encounter (Signed)
Medication Refill - Medication: promethazine (PHENERGAN) 25 MG tablet  SUMAtriptan (IMITREX) 100 MG tablet  meclizine (ANTIVERT) 25 MG tablet  Has the patient contacted their pharmacy? Yes.   (Agent: If no, request that the patient contact the pharmacy for the refill.) (Agent: If yes, when and what did the pharmacy advise?)no refills or expired Rx   Preferred Pharmacy (with phone number or street name): [facility][id number] Nicholes Rough, [place] - [address] AT [facility] OF [address]  [address] [place], [place] [address]  Phone:  [phone]  Fax:  [phone]   Agent: Please be advised that RX refills may take up to 3 business days. We ask that you follow-up with your pharmacy.

## 2021-02-27 ENCOUNTER — Ambulatory Visit: Payer: Medicaid Other | Admitting: Family Medicine

## 2021-02-27 ENCOUNTER — Encounter: Payer: Self-pay | Admitting: Family Medicine

## 2021-02-27 ENCOUNTER — Other Ambulatory Visit: Payer: Self-pay

## 2021-02-27 VITALS — BP 139/100 | HR 90 | Temp 97.5°F | Ht 73.0 in | Wt 300.0 lb

## 2021-02-27 DIAGNOSIS — R7303 Prediabetes: Secondary | ICD-10-CM | POA: Diagnosis not present

## 2021-02-27 DIAGNOSIS — I10 Essential (primary) hypertension: Secondary | ICD-10-CM

## 2021-02-27 DIAGNOSIS — G43911 Migraine, unspecified, intractable, with status migrainosus: Secondary | ICD-10-CM | POA: Diagnosis not present

## 2021-02-27 DIAGNOSIS — R030 Elevated blood-pressure reading, without diagnosis of hypertension: Secondary | ICD-10-CM | POA: Diagnosis not present

## 2021-02-27 DIAGNOSIS — E785 Hyperlipidemia, unspecified: Secondary | ICD-10-CM | POA: Diagnosis not present

## 2021-02-27 DIAGNOSIS — Z0000 Encounter for general adult medical examination without abnormal findings: Secondary | ICD-10-CM | POA: Diagnosis not present

## 2021-02-27 DIAGNOSIS — E6609 Other obesity due to excess calories: Secondary | ICD-10-CM | POA: Diagnosis not present

## 2021-02-27 DIAGNOSIS — Z6838 Body mass index (BMI) 38.0-38.9, adult: Secondary | ICD-10-CM

## 2021-02-27 NOTE — Progress Notes (Signed)
Established patient visit   Patient: Alex Perkins   DOB: [birth year]   35 y.o. Male  MRN: [id number] Visit Date: 02/27/2021  Today's healthcare provider: Megan Mans, MD   Chief Complaint  Patient presents with   Migraine  Complete physical exam Subjective   Patient comes in today for physical.  He has not had a physical in several years. He is married and a father of 2.  He gets some regular exercise now. -------------------------------------------------------------------------------------------------------------------- Migraine  This is a recurrent problem. The problem has been gradually worsening. The pain quality is similar to prior headaches. The patient is experiencing no pain. Associated symptoms include dizziness. Pertinent negatives include no eye pain, eye redness, photophobia or seizures.        Medications: Outpatient Medications Prior to Visit  Medication Sig   cetirizine (ZYRTEC) 10 MG tablet Take 1 tablet (10 mg total) by mouth daily.   cyclobenzaprine (FLEXERIL) 10 MG tablet cyclobenzaprine 10 mg tablet  TK 1 T PO Q 8 H PRF SPASM   fluticasone (FLONASE) 50 MCG/ACT nasal spray Place 2 sprays into both nostrils daily.   meclizine (ANTIVERT) 25 MG tablet TAKE 1 TABLET(25 MG) BY MOUTH EVERY 6 HOURS AS NEEDED FOR DIZZINESS   promethazine (PHENERGAN) 25 MG tablet Take 1 tablet (25 mg total) by mouth every 6 (six) hours as needed for nausea or vomiting.   SUMAtriptan (IMITREX) 100 MG tablet TAKE 1 TABLET BY MOUTH ONCE AS NEEDED FOR MIGRAINE FOR UP TO 1 DOSE. MAY TAKE A SECOND DOSE AFTER 2 HOURS IF NEEDED   montelukast (SINGULAIR) 10 MG tablet Take 1 tablet (10 mg total) by mouth at bedtime.   rosuvastatin (CRESTOR) 10 MG tablet Take 1 tablet (10 mg total) by mouth daily. (Patient not taking: Reported on 02/27/2021)   SUMAtriptan (IMITREX) 100 MG tablet TAKE 1 TABLET BY MOUTH ONCE AS NEEDED FOR MIGRAINE FOR UP TO 1 DOSE. MAY TAKE A SECOND DOSE AFTER 2 HOURS IF  NEEDED   No facility-administered medications prior to visit.    Review of Systems  Constitutional: Negative.   Eyes:  Negative for photophobia, pain, discharge, redness, itching and visual disturbance.  Respiratory: Negative.    Cardiovascular: Negative.   Gastrointestinal: Negative.   Neurological:  Positive for dizziness. Negative for seizures, syncope, light-headedness and headaches.      Objective  -------------------------------------------------------------------------------------------------------------------- BP (!) 139/100 (BP Location: Left Arm, Patient Position: Sitting, Cuff Size: Large)   Pulse 90   Temp (!) 97.5 F (36.4 C) (Oral)   Ht 6\' 1"  (1.854 m)   Wt 300 lb (136.1 kg)   BMI 39.58 kg/m     Physical Exam Vitals reviewed.  Constitutional:      Appearance: Normal appearance. He is obese.     Comments: He is a muscular but overweight gentleman in no acute distress  HENT:     Head: Normocephalic and atraumatic.     Right Ear: External ear normal.     Left Ear: External ear normal.     Mouth/Throat:     Mouth: Mucous membranes are dry.  Eyes:     General: No scleral icterus. Cardiovascular:     Rate and Rhythm: Normal rate and regular rhythm.     Pulses: Normal pulses.     Heart sounds: Normal heart sounds.  Pulmonary:     Effort: Pulmonary effort is normal.     Breath sounds: Normal breath sounds.  Abdominal:     Palpations:  Abdomen is soft.  Skin:    General: Skin is warm and dry.  Neurological:     General: No focal deficit present.     Mental Status: He is alert and oriented to person, place, and time.  Psychiatric:        Mood and Affect: Mood normal.        Behavior: Behavior normal.        Thought Content: Thought content normal.        Judgment: Judgment normal.      No results found for any visits on 02/27/21.  Assessment & Plan   ---------------------------------------------------------------------------------------------------------------------- 1. Annual physical exam  - CBC - Comprehensive metabolic panel - Lipid panel - TSH - Hemoglobin A1c  2. Hyperlipidemia, unspecified hyperlipidemia type  - Comprehensive metabolic panel - Lipid panel  3. Prediabetes Obtain A1c - Comprehensive metabolic panel - Hemoglobin A1c  4. Class 2 obesity due to excess calories without serious comorbidity with body mass index (BMI) of 38.0 to 38.9 in adult Goal waist size of 36  5. Intractable migraine with status migrainosus, unspecified migraine type Clinically stable on Imitrex.  Consider Nurtec  6. Elevated blood pressure reading Check blood pressure readings at home and follow-up in January.  Work on weight loss and aerobic exercise   No follow-ups on file.      I, Alex Mans, MD, have reviewed all documentation for this visit. The documentation on 03/06/21 for the exam, diagnosis, procedures, and orders are all accurate and complete.    Alex Mccadden Wendelyn Breslow, MD  [facility] [phone] (phone) [phone] (fax)  [facility]

## 2021-02-27 NOTE — Patient Instructions (Signed)
Use Omorion or [place]lyn BP cuff to monitor BP at home.

## 2021-02-28 ENCOUNTER — Other Ambulatory Visit: Payer: Self-pay | Admitting: Family Medicine

## 2021-02-28 DIAGNOSIS — G43109 Migraine with aura, not intractable, without status migrainosus: Secondary | ICD-10-CM

## 2021-02-28 MED ORDER — SUMATRIPTAN SUCCINATE 100 MG PO TABS: 100.0000 mg | ORAL_TABLET | ORAL | 0 refills | Status: DC | PRN

## 2021-02-28 MED ORDER — MECLIZINE HCL 25 MG PO TABS: 25.0000 mg | ORAL_TABLET | Freq: Three times a day (TID) | ORAL | 0 refills | Status: DC | PRN

## 2021-03-03 ENCOUNTER — Ambulatory Visit: Payer: Medicaid Other | Admitting: Family Medicine

## 2021-03-04 ENCOUNTER — Telehealth: Payer: Self-pay

## 2021-03-04 MED ORDER — PROMETHAZINE HCL 25 MG PO TABS: 25.0000 mg | ORAL_TABLET | Freq: Four times a day (QID) | ORAL | 3 refills | Status: DC | PRN

## 2021-03-04 NOTE — Telephone Encounter (Signed)
Requested medication (s) are due for refill today: Yes  Requested medication (s) are on the active medication list: Yes  Last refill:  06/10/19  Future visit scheduled:Yes  Notes to clinic:  Unable to refill per protocol, Rx expired. Patient is requesting a prescription for contour next one blood test strips       Requested Prescriptions  Pending Prescriptions Disp Refills   promethazine (PHENERGAN) 25 MG tablet 30 tablet 3    Sig: Take 1 tablet (25 mg total) by mouth every 6 (six) hours as needed for nausea or vomiting.     Not Delegated - Gastroenterology: Antiemetics Failed - 03/04/2021 10:17 AM      Failed - This refill cannot be delegated      Passed - Valid encounter within last 6 months    Recent Outpatient Visits           5 days ago Hyperlipidemia, unspecified hyperlipidemia type   [facility] Maple Hudson., MD   2 years ago ASCVD (arteriosclerotic cardiovascular disease)   [facility] Maple Hudson., MD   2 years ago Dizziness   [facility] Maple Hudson., MD   2 years ago Allergic rhinitis, unspecified seasonality, unspecified trigger   [facility] Maple Hudson., MD   2 years ago Vertigo   [facility] Maple Hudson., MD       Future Appointments             In 4 months Maple Hudson., MD [facility]

## 2021-03-04 NOTE — Telephone Encounter (Signed)
Patient is requesting a prescription for contour next one blood test strips and Promethazine to be sent to:  [facility][id number] Nicholes Rough, [place] - [address] AT [facility] OF [address] Phone:  [phone]  Fax:  [phone]

## 2021-03-04 NOTE — Telephone Encounter (Signed)
Routed to office.

## 2021-03-07 NOTE — Telephone Encounter (Signed)
This encounter was created in error - please disregard.

## 2021-03-13 LAB — COMPREHENSIVE METABOLIC PANEL
ALT: 44 IU/L (ref 0–44)
AST: 30 IU/L (ref 0–40)
Albumin/Globulin Ratio: 0.3 — ABNORMAL LOW (ref 1.2–2.2)
Albumin: 1.7 g/dL — CL (ref 4.0–5.0)
Alkaline Phosphatase: 84 IU/L (ref 44–121)
BUN/Creatinine Ratio: 14 (ref 9–20)
BUN: 14 mg/dL (ref 6–20)
Calcium: 9.6 mg/dL (ref 8.7–10.2)
Chloride: 101 mmol/L (ref 96–106)
Creatinine, Ser: 1.01 mg/dL (ref 0.76–1.27)
Globulin, Total: 5.5 g/dL — ABNORMAL HIGH (ref 1.5–4.5)
Glucose: 135 mg/dL — ABNORMAL HIGH (ref 65–99)
Potassium: 4.8 mmol/L (ref 3.5–5.2)
Sodium: 141 mmol/L (ref 134–144)
Total Protein: 7.2 g/dL (ref 6.0–8.5)
eGFR: 98 mL/min/{1.73_m2} (ref >59)

## 2021-03-13 LAB — LIPID PANEL
Chol/HDL Ratio: 13.9 ratio — ABNORMAL HIGH (ref 0.0–5.0)
Cholesterol, Total: 277 mg/dL — ABNORMAL HIGH (ref 100–199)
HDL: 20 mg/dL — ABNORMAL LOW (ref >39)
LDL Chol Calc (NIH): 197 mg/dL — ABNORMAL HIGH (ref 0–99)
Triglycerides: 298 mg/dL — ABNORMAL HIGH (ref 0–149)
VLDL Cholesterol Cal: 60 mg/dL — ABNORMAL HIGH (ref 5–40)

## 2021-03-13 LAB — CBC
Hematocrit: 49.3 % (ref 37.5–51.0)
Hemoglobin: 16.7 g/dL (ref 13.0–17.7)
MCH: 29.4 pg (ref 26.6–33.0)
MCHC: 33.9 g/dL (ref 31.5–35.7)
MCV: 87 fL (ref 79–97)
Platelets: 267 10*3/uL (ref 150–450)
RBC: 5.68 x10E6/uL (ref 4.14–5.80)
RDW: 12.2 % (ref 11.6–15.4)
WBC: 7.4 10*3/uL (ref 3.4–10.8)

## 2021-03-13 LAB — HEMOGLOBIN A1C
Est. average glucose Bld gHb Est-mCnc: 154 mg/dL
Hgb A1c MFr Bld: 7 % — ABNORMAL HIGH (ref 4.8–5.6)

## 2021-03-13 LAB — TSH: TSH: 1.82 u[IU]/mL (ref 0.450–4.500)

## 2021-03-24 MED ORDER — CONTOUR NEXT TEST VI STRP: ORAL_STRIP | 12 refills | Status: DC

## 2021-03-24 NOTE — Telephone Encounter (Signed)
Patient says [facility] did not receive Promethazine or Contour Next One Blood Test Strips.

## 2021-03-24 NOTE — Addendum Note (Signed)
Addended by: Anson [place] on: 03/24/2021 04:14 PM   Modules accepted: Orders

## 2021-03-25 ENCOUNTER — Ambulatory Visit: Payer: Medicaid Other | Admitting: Family Medicine

## 2021-05-06 ENCOUNTER — Encounter: Payer: Self-pay | Admitting: Family Medicine

## 2021-05-14 ENCOUNTER — Other Ambulatory Visit: Payer: Self-pay

## 2021-05-14 MED ORDER — PROMETHAZINE HCL 25 MG PO TABS: 25.0000 mg | ORAL_TABLET | Freq: Four times a day (QID) | ORAL | 3 refills | Status: DC | PRN

## 2021-05-21 ENCOUNTER — Encounter: Payer: Self-pay | Admitting: Physician Assistant

## 2021-05-21 ENCOUNTER — Encounter: Payer: Self-pay | Admitting: Family Medicine

## 2021-05-21 ENCOUNTER — Ambulatory Visit (INDEPENDENT_AMBULATORY_CARE_PROVIDER_SITE_OTHER): Payer: Medicaid Other | Admitting: Physician Assistant

## 2021-05-21 ENCOUNTER — Other Ambulatory Visit: Payer: Self-pay

## 2021-05-21 VITALS — BP 139/93 | HR 82 | Temp 98.5°F | Wt 269.0 lb

## 2021-05-21 DIAGNOSIS — R030 Elevated blood-pressure reading, without diagnosis of hypertension: Secondary | ICD-10-CM

## 2021-05-21 DIAGNOSIS — Z23 Encounter for immunization: Secondary | ICD-10-CM | POA: Diagnosis not present

## 2021-05-21 DIAGNOSIS — F419 Anxiety disorder, unspecified: Secondary | ICD-10-CM

## 2021-05-21 MED ORDER — FREESTYLE LIBRE 3 SENSOR MISC: 1.0000 | 11 refills | Status: DC

## 2021-05-21 MED ORDER — CONTOUR NEXT TEST VI STRP: ORAL_STRIP | 12 refills | Status: AC

## 2021-05-21 NOTE — Patient Instructions (Signed)
For anxiety - look into Calm app and meditation practices For elevated blood pressure - continue exercise and diet, will follow up in Jan with next apt  Will check A1c at next apt as well.

## 2021-05-21 NOTE — Assessment & Plan Note (Addendum)
Patient at -home readings are typically 113-125/74-85 and he admits to mild anxiety with apt.  Follow up at next apt in Jan Continue diet and exercise regimen  Provided education on hypertension and appropriate thresholds for management/cardiology referral  Will check A1c at next visit as well as lipid panel for ongoing management

## 2021-05-21 NOTE — Assessment & Plan Note (Addendum)
Provided education on anxiety and relaxation techniques  Encouraged continuity of current measures (spending time with family, work-life balance, etc.)  Provided patient information on anxiety and resources for reducing anxiety. GAD-7 on subsequent apts for monitoring

## 2021-05-21 NOTE — Progress Notes (Signed)
Established patient visit   Patient: Alex Perkins   DOB: 01-18-1986   35 y.o. Male  MRN: [id number] Visit Date: 05/21/2021  Today's healthcare provider: Almon Register, PA-C   Chief Complaint  Patient presents with   Hypertension   Subjective  Introduced myself to the patient as a PA-C and provided education on APPs in clinical practice.    HPI  Pt is requesting a referral to cardiology.   Hypertension, follow-up  BP Readings from Last 3 Encounters:  05/21/21 (!) 139/93  02/27/21 (!) 139/100  03/01/19 121/64   Wt Readings from Last 3 Encounters:  05/21/21 269 lb (122 kg)  02/27/21 300 lb (136.1 kg)  02/28/19 267 lb (121.1 kg)     He was last seen for hypertension 3 months ago.  BP at that visit was 139/100. Management since that visit includes no changes, but work on lifestyle changes.  He reports excellent compliance with treatment. He is not having side effects.  He is following a Low Sodium diet. He is exercising. He does not smoke.  Use of agents associated with hypertension: none.   Outside blood pressures are normal at home, except for reading yesterday where BP was elevated after a salty meal.  Patient reports he has lost approx 30lbs since Aug through exercise and diet  States he has cut out sugar and carbs from fruit only. States he has increased vegetables.  States he is walking 3-5 miles per day.   States he is having intermittent bilateral arm pain after work as well as intermittent right-sided chest pain   Typically at-home BP readings are 113/69, 113/79, 117/78 States BP 125/85 this AM before appt.  States glucose levels are 80-90 at night and 120-140 during day depending on what he eats during the day.   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  Symptoms: Yes chest pain No chest pressure  No palpitations No syncope  No dyspnea No orthopnea  No paroxysmal nocturnal dyspnea No lower extremity  edema   Pertinent labs: Lab Results  Component Value Date   CHOL 277 (H) 02/27/2021   HDL 20 (L) 02/27/2021   LDLCALC 197 (H) 02/27/2021   TRIG 298 (H) 02/27/2021   CHOLHDL 13.9 (H) 02/27/2021   Lab Results  Component Value Date   NA 141 02/27/2021   K 4.8 02/27/2021   CREATININE 1.01 02/27/2021   EGFR 98 02/27/2021   GLUCOSE 135 (H) 02/27/2021   TSH 1.820 02/27/2021     The ASCVD Risk score (Arnett DK, et al., 2019) failed to calculate for the following reasons:   The 2019 ASCVD risk score is only valid for ages 58 to 59   ---------------------------------------------------------------------------------------------------    Medications: Outpatient Medications Prior to Visit  Medication Sig   Continuous Blood Gluc Sensor (FREESTYLE LIBRE 3 SENSOR) MISC 1 each by Does not apply route every 14 (fourteen) days. Place 1 sensor on the skin every 14 days. Use to check glucose continuously   cyclobenzaprine (FLEXERIL) 10 MG tablet cyclobenzaprine 10 mg tablet  TK 1 T PO Q 8 H PRF SPASM   glucose blood (CONTOUR NEXT TEST) test strip Use as instructed   meclizine (ANTIVERT) 25 MG tablet Take 1 tablet (25 mg total) by mouth 3 (three) times daily as needed for dizziness. TAKE 1 TABLET(25 MG) BY MOUTH EVERY 6 HOURS AS NEEDED FOR DIZZINESS   promethazine (PHENERGAN) 25 MG tablet Take 1 tablet (25 mg  total) by mouth every 6 (six) hours as needed for nausea or vomiting.   SUMAtriptan (IMITREX) 100 MG tablet Take 1 tablet (100 mg total) by mouth every 2 (two) hours as needed for migraine. May repeat in 2 hours if headache persists or recurs.TAKE 1 TABLET BY MOUTH ONCE AS NEEDED FOR MIGRAINE FOR UP TO 1 DOSE. MAY TAKE A SECOND DOSE AFTER 2 HOURS IF NEEDED   cetirizine (ZYRTEC) 10 MG tablet Take 1 tablet (10 mg total) by mouth daily.   fluticasone (FLONASE) 50 MCG/ACT nasal spray Place 2 sprays into both nostrils daily.   montelukast (SINGULAIR) 10 MG tablet Take 1 tablet (10 mg total) by mouth  at bedtime.   rosuvastatin (CRESTOR) 10 MG tablet Take 1 tablet (10 mg total) by mouth daily.   SUMAtriptan (IMITREX) 100 MG tablet TAKE 1 TABLET BY MOUTH ONCE AS NEEDED FOR MIGRAINE FOR UP TO 1 DOSE. MAY TAKE A SECOND DOSE AFTER 2 HOURS IF NEEDED   No facility-administered medications prior to visit.    Review of Systems  Constitutional: Negative.  Negative for unexpected weight change.  Respiratory: Negative.    Cardiovascular:  Positive for chest pain (? Muscle pain from lifting.).  Gastrointestinal: Negative.   Musculoskeletal:  Positive for myalgias (right sided chest pain). Negative for arthralgias.  Neurological:  Positive for light-headedness. Negative for dizziness and headaches.      Objective    BP (!) 139/93 (BP Location: Right Arm, Patient Position: Sitting, Cuff Size: Large)   Pulse 82   Temp 98.5 F (36.9 C) (Oral)   Wt 269 lb (122 kg)   SpO2 100%   BMI 35.49 kg/m    Physical Exam Constitutional:      Appearance: Normal appearance.  HENT:     Head: Normocephalic.  Eyes:     Conjunctiva/sclera: Conjunctivae normal.  Cardiovascular:     Rate and Rhythm: Normal rate and regular rhythm.     Pulses: Normal pulses.     Heart sounds: Normal heart sounds.  Pulmonary:     Effort: Pulmonary effort is normal.     Breath sounds: Normal breath sounds.  Musculoskeletal:     Right lower leg: No edema.     Left lower leg: No edema.  Skin:    General: Skin is warm and dry.  Neurological:     Mental Status: He is alert and oriented to person, place, and time.  Psychiatric:        Mood and Affect: Mood normal.        Thought Content: Thought content normal.      No results found for any visits on 05/21/21.  Assessment & Plan     Problem List Items Addressed This Visit       Other   Elevated BP without diagnosis of hypertension - Primary    Patient at -home readings are typically 113-125/74-85 and he admits to mild anxiety with apt.  Follow up at next apt  in Jan Continue diet and exercise regimen  Provided education on hypertension and appropriate thresholds for management/cardiology referral  Will check A1c at next visit as well as lipid panel for ongoing management       Anxious mood    Provided education on anxiety and relaxation techniques  Encouraged continuity of current measures (spending time with family, work-life balance, etc.)  Provided patient information on anxiety and resources for reducing anxiety. GAD-7 on subsequent apts for monitoring       Other Visit Diagnoses  Need for influenza vaccination       Relevant Orders   Flu Vaccine QUAD 6+ mos PF IM (Fluarix Quad PF) (Completed)         No follow-ups on file.   The entirety of the information documented in the History of Present Illness, Review of Systems and Physical Exam were personally obtained by me. Portions of this information were initially documented by the CMA and reviewed by me for thoroughness and accuracy.   Dani Gobble Bowie Delia, PA-C   Almon Register, PA-C  [employer] [phone] (phone) [phone] (fax)  [place]

## 2021-05-22 ENCOUNTER — Encounter: Payer: Self-pay | Admitting: Physician Assistant

## 2021-06-08 DIAGNOSIS — M25561 Pain in right knee: Secondary | ICD-10-CM | POA: Diagnosis not present

## 2021-06-12 ENCOUNTER — Encounter: Payer: Self-pay | Admitting: Family Medicine

## 2021-06-12 ENCOUNTER — Encounter: Payer: Self-pay | Admitting: Physician Assistant

## 2021-07-04 ENCOUNTER — Ambulatory Visit: Payer: Self-pay

## 2021-07-04 NOTE — Telephone Encounter (Signed)
°  Chief Complaint: low BS Symptoms: no symptoms Frequency: today Pertinent Negatives: NA Disposition: [] ED /[] Urgent Care (no appt availability in office) / [] Appointment(In office/virtual)/ []  [place] [facility]/ [x] Home Care/ [] Refused Recommended Disposition /[] [place] [facility]/ []  Follow-up with PCP Additional Notes: Pt called in states he has had low BS this afternoon started at 65 ate a banana came up then dropped again so he ate a granola bar. Pt just checked BS and it was 94. He asked about when he should go to ED to be seen. Advised him if BS dropped < 60/70 and was symptomatic he could go to ED to seen. Advised him to get a good supper and rest and monitor BS and if remains concerned can schedule an appt with provider to come in and be seen. Care advice given and pt verbalized understanding. No other questions/concerns noted.     Reason for Disposition  [1] Blood glucose < 70 mg/dL (3.9 mmol/L) or symptomatic AND [2] cause known  Answer Assessment - Initial Assessment Questions 1. SYMPTOMS: "What symptoms are you concerned about?"     Low BS 65 2. ONSET:  "When did the symptoms start?"     now 3. BLOOD GLUCOSE: "What is your blood glucose level?"      83 4. USUAL RANGE: "What is your blood glucose level usually?" (e.g., usual fasting morning value, usual evening value)     100-140 5. TYPE 1 or 2:  "Do you know what type of diabetes you have?"  (e.g., Type 1, Type 2, Gestational; doesn't know)      DM 2 6. INSULIN: "Do you take insulin?" "What type of insulin(s) do you use? What is the mode of delivery? (syringe, pen; injection or pump) "When did you last give yourself an insulin dose?" (i.e., time or hours/minutes ago) "How much did you give?" (i.e., how many units)     No 7. DIABETES PILLS: "Do you take any pills for your diabetes?"     No 8. OTHER SYMPTOMS: "Do you have any symptoms?" (e.g., fever, frequent urination, difficulty breathing, vomiting)     nervous 9.  LOW BLOOD GLUCOSE TREATMENT: "What have you done so far to treat the low blood glucose level?"     Ate banana, 2 granola bras 10. FOOD: "When did you last eat or drink?"       Few mins ago  Protocols used: Diabetes - Low Blood Sugar-A-AH

## 2021-07-06 ENCOUNTER — Encounter: Payer: Self-pay | Admitting: Physician Assistant

## 2021-07-07 ENCOUNTER — Encounter: Payer: Self-pay | Admitting: Physician Assistant

## 2021-07-08 ENCOUNTER — Other Ambulatory Visit: Payer: Self-pay

## 2021-07-08 ENCOUNTER — Ambulatory Visit: Payer: Medicaid Other | Admitting: Family Medicine

## 2021-07-08 ENCOUNTER — Ambulatory Visit: Payer: Self-pay | Admitting: *Deleted

## 2021-07-08 ENCOUNTER — Encounter: Payer: Self-pay | Admitting: Family Medicine

## 2021-07-08 VITALS — BP 133/95 | HR 89 | Temp 98.1°F | Wt 262.0 lb

## 2021-07-08 DIAGNOSIS — E785 Hyperlipidemia, unspecified: Secondary | ICD-10-CM

## 2021-07-08 DIAGNOSIS — R42 Dizziness and giddiness: Secondary | ICD-10-CM

## 2021-07-08 DIAGNOSIS — E119 Type 2 diabetes mellitus without complications: Secondary | ICD-10-CM | POA: Diagnosis not present

## 2021-07-08 DIAGNOSIS — F419 Anxiety disorder, unspecified: Secondary | ICD-10-CM | POA: Diagnosis not present

## 2021-07-08 LAB — POCT GLYCOSYLATED HEMOGLOBIN (HGB A1C): Hemoglobin A1C: 5.7 % — AB (ref 4.0–5.6)

## 2021-07-08 NOTE — Telephone Encounter (Signed)
°  Chief Complaint: dizziness, blood glucose dropping after eating Symptoms: dizziness, headaches, feeling cold in hands and feet. Frequency: on and off Pertinent Negatives: Patient denies passing out Disposition: [] ED /[] Urgent Care (no appt availability in office) / [x] Appointment(In office/virtual)/ []  [place] [facility]/ [] Home Care/ [] Refused Recommended Disposition /[] [place] [facility]/ []  Follow-up with PCP Additional Notes: I made him an appt with Dr. for this morning at 9:00 in office visit.   Instructed him to go to the ED if he got worse.

## 2021-07-08 NOTE — Telephone Encounter (Signed)
Reason for Disposition  [1] MODERATE dizziness (e.g., interferes with normal activities) AND [2] has NOT been evaluated by physician for this  (Exception: dizziness caused by heat exposure, sudden standing, or poor fluid intake)  Answer Assessment - Initial Assessment Questions 1. DESCRIPTION: "Describe your dizziness."     I don't take insulin or medications.   I have type II diabetes.   I do check my glucose.    I lost 40 lbs and got it under control. I've been under a lot of stress lately.    I had a stressful lunch and I was working and my continuous glucose monitor went off.   It was 68 my glucose.   Idouble checked it and it was 68.     I have a Libre 3 so I could see my results.   Mine is 140 to 70's.    If I exert myself my glucose goes down.  When I eat my sugar is dropping.   2. LIGHTHEADED: "Do you feel lightheaded?" (e.g., somewhat faint, woozy, weak upon standing)     I'm dizzy even when my numbers are good.   My fingers and hands are cold all the time.    No nausea.    I'm taking the meclizine.   I'm having headaches and dizziness.    3. VERTIGO: "Do you feel like either you or the room is spinning or tilting?" (i.e. vertigo)     *No Answer* 4. SEVERITY: "How bad is it?"  "Do you feel like you are going to faint?" "Can you stand and walk?"   - MILD: Feels slightly dizzy, but walking normally.   - MODERATE: Feels unsteady when walking, but not falling; interferes with normal activities (e.g., school, work).   - SEVERE: Unable to walk without falling, or requires assistance to walk without falling; feels like passing out now.      *No Answer* 5. ONSET:  "When did the dizziness begin?"     *No Answer* 6. AGGRAVATING FACTORS: "Does anything make it worse?" (e.g., standing, change in head position)     *No Answer* 7. HEART RATE: "Can you tell me your heart rate?" "How many beats in 15 seconds?"  (Note: not all patients can do this)       *No Answer* 8. CAUSE: "What do you think is  causing the dizziness?"     *No Answer* 9. RECURRENT SYMPTOM: "Have you had dizziness before?" If Yes, ask: "When was the last time?" "What happened that time?"     *No Answer* 10. OTHER SYMPTOMS: "Do you have any other symptoms?" (e.g., fever, chest pain, vomiting, diarrhea, bleeding)       *No Answer* 11. PREGNANCY: "Is there any chance you are pregnant?" "When was your last menstrual period?"       *No Answer*  Protocols used: Dizziness - Lightheadedness-A-AH

## 2021-07-08 NOTE — Telephone Encounter (Signed)
Seen in office today with Dr. Caryn Section- labs ordered and diabetes regimen appears to have been reviewed.

## 2021-07-08 NOTE — Progress Notes (Signed)
°  ° ° °Established patient visit ° ° °Patient: Alex Perkins   DOB: 08/12/1985   36 y.o. Male  MRN: [id number] °Visit Date: 07/08/2021 ° °Today's healthcare provider: Donald Fisher, MD  ° °Chief Complaint  °Patient presents with  ° Diabetes  ° °Subjective  °  °  °Diabetes Mellitus Type II, Follow-up ° °Lab Results  °Component Value Date  ° HGBA1C 7.0 (H) 02/27/2021  ° HGBA1C 6.3 (A) 12/08/2018  ° HGBA1C 5.9 (H) 07/03/2016  ° °Wt Readings from Last 3 Encounters:  °07/08/21 262 lb (118.8 kg)  °05/21/21 269 lb (122 kg)  °02/27/21 300 lb (136.1 kg)  ° °Last seen for diabetes 4 months ago.  °Management since then includes advising pt to start metformin, pt declined at that time and is going to work on lifestyle changes. °He reports excellent compliance with treatment. °He is not having side effects.  °Symptoms: °No fatigue No foot ulcerations  °No appetite changes No nausea  °No paresthesia of the feet  No polydipsia  °No polyuria No visual disturbances   °No vomiting   ° ° ° °Pertinent Labs: °Lab Results  °Component Value Date  ° CHOL 277 (H) 02/27/2021  ° HDL 20 (L) 02/27/2021  ° LDLCALC 197 (H) 02/27/2021  ° TRIG 298 (H) 02/27/2021  ° CHOLHDL 13.9 (H) 02/27/2021  ° Lab Results  °Component Value Date  ° NA 141 02/27/2021  ° K 4.8 02/27/2021  ° CREATININE 1.01 02/27/2021  ° EGFR 98 02/27/2021  °  ° °Since last visit he has been on very strict diet with what sounds like intermittent fastin completely eliminating sugars and starches. He is noted to have lost nearly 40 pounds since August. He states that over the holidays he started introducing a little bit of sugar and starches into diet and his blood sugar have since been volatile with spikes into the mid 100s and drops into the low 60s. Has also been much more fatigued and just doesn't feel right the last few weeks. He does have some chronic anxiety for which he takes OTC prn meclizine. He tried sertraline in the past which just made him dizzy.   °--------------------------------------------------------------------------------------------------- ° ° °Medications: °Outpatient Medications Prior to Visit  °Medication Sig  ° cetirizine (ZYRTEC) 10 MG tablet Take 1 tablet (10 mg total) by mouth daily.  ° Continuous Blood Gluc Sensor (FREESTYLE LIBRE 3 SENSOR) MISC 1 each by Does not apply route every 14 (fourteen) days. Place 1 sensor on the skin every 14 days. Use to check glucose continuously  ° cyclobenzaprine (FLEXERIL) 10 MG tablet cyclobenzaprine 10 mg tablet ° TK 1 T PO Q 8 H PRF SPASM  ° fluticasone (FLONASE) 50 MCG/ACT nasal spray Place 2 sprays into both nostrils daily.  ° glucose blood (CONTOUR NEXT TEST) test strip Use as instructed  ° meclizine (ANTIVERT) 25 MG tablet Take 1 tablet (25 mg total) by mouth 3 (three) times daily as needed for dizziness. TAKE 1 TABLET(25 MG) BY MOUTH EVERY 6 HOURS AS NEEDED FOR DIZZINESS  ° promethazine (PHENERGAN) 25 MG tablet Take 1 tablet (25 mg total) by mouth every 6 (six) hours as needed for nausea or vomiting.  ° SUMAtriptan (IMITREX) 100 MG tablet Take 1 tablet (100 mg total) by mouth every 2 (two) hours as needed for migraine. May repeat in 2 hours if headache persists or recurs.TAKE 1 TABLET BY MOUTH ONCE AS NEEDED FOR MIGRAINE FOR UP TO 1 DOSE. MAY TAKE A SECOND DOSE AFTER 2 HOURS   IF NEEDED  ° °No facility-administered medications prior to visit.  ° ° °Review of Systems  °Constitutional: Negative.   °Respiratory: Negative.    °Cardiovascular: Negative.   °Gastrointestinal: Negative.   °Endocrine: Negative.   °Skin:  Negative for wound.  °Neurological:  Positive for dizziness. Negative for speech difficulty, light-headedness, numbness and headaches.  ° ° °  Objective  °  °BP (!) 133/95 (BP Location: Right Arm, Patient Position: Sitting, Cuff Size: Large)    Pulse 89    Temp 98.1 °F (36.7 °C) (Oral)    Wt 262 lb (118.8 kg)    SpO2 100%    BMI 34.57 kg/m²  ° ° °Physical Exam  ° °General: Appearance:    Obese  male in no acute distress  °Eyes:    PERRL, conjunctiva/corneas clear, EOM's intact       °Lungs:     Clear to auscultation bilaterally, respirations unlabored  °Heart:    Normal heart rate. Normal rhythm. No murmurs, rubs, or gallops.    °MS:   All extremities are intact.    °Neurologic:   Awake, alert, oriented x 3. No apparent focal neurological defect.   °   ° °Results for orders placed or performed in visit on 07/08/21  °POCT glycosylated hemoglobin (Hb A1C)  °Result Value Ref Range  ° Hemoglobin A1C 5.7 (A) 4.0 - 5.6 %  °  ° Assessment & Plan  °  ° °1. Controlled type 2 diabetes mellitus without complication, without long-term current use of insulin (HCC) °A1c is much better with 40 pound weight loss since august.  °- TSH ° °2. Hyperlipidemia, unspecified hyperlipidemia type ° °- Lipid panel ° °3. Dizziness °Multifactorial. He has had intentional 40 pound weight loss since August, now with episodes of blood sugars down to the 60s which he is note used to. Advised that this is actually a normal blood sugar, but it may take a few months to adjust no a more normal glucose metabolism. Will check: °- CBC °- Comprehensive metabolic panel °- TSH ° °Advised to maintain current weight, but don't attempt to lose any more for a month or two.   ° °4. Anxiety °He doesn't want to take daily medication at this point, but seems to do with OTC meclizine prn   °   ° °The entirety of the information documented in the History of Present Illness, Review of Systems and Physical Exam were personally obtained by me. Portions of this information were initially documented by the CMA and reviewed by me for thoroughness and accuracy.   ° ° °Donald Fisher, MD  °[facility] °[phone] (phone) °[phone] (fax) ° °[facility] ° °

## 2021-07-08 NOTE — Telephone Encounter (Signed)
Seen today in office by Dr. Sherrie Mustache- labs ordered and diabetes regimen appears to have been reviewed

## 2021-07-09 ENCOUNTER — Encounter: Payer: Self-pay | Admitting: Family Medicine

## 2021-07-09 ENCOUNTER — Other Ambulatory Visit: Payer: Self-pay | Admitting: Family Medicine

## 2021-07-09 DIAGNOSIS — E785 Hyperlipidemia, unspecified: Secondary | ICD-10-CM | POA: Insufficient documentation

## 2021-07-09 LAB — COMPREHENSIVE METABOLIC PANEL
ALT: 30 IU/L (ref 0–44)
AST: 20 IU/L (ref 0–40)
Albumin/Globulin Ratio: 1.9 (ref 1.2–2.2)
Albumin: 4.6 g/dL (ref 4.0–5.0)
Alkaline Phosphatase: 80 IU/L (ref 44–121)
BUN/Creatinine Ratio: 17 (ref 9–20)
BUN: 17 mg/dL (ref 6–20)
Bilirubin Total: 0.6 mg/dL (ref 0.0–1.2)
CO2: 24 mmol/L (ref 20–29)
Calcium: 9.6 mg/dL (ref 8.7–10.2)
Chloride: 105 mmol/L (ref 96–106)
Creatinine, Ser: 0.98 mg/dL (ref 0.76–1.27)
Globulin, Total: 2.4 g/dL (ref 1.5–4.5)
Glucose: 112 mg/dL — ABNORMAL HIGH (ref 70–99)
Potassium: 4.9 mmol/L (ref 3.5–5.2)
Sodium: 142 mmol/L (ref 134–144)
Total Protein: 7 g/dL (ref 6.0–8.5)
eGFR: 102 mL/min/{1.73_m2} (ref >59)

## 2021-07-09 LAB — LIPID PANEL
Chol/HDL Ratio: 6.6 ratio — ABNORMAL HIGH (ref 0.0–5.0)
Cholesterol, Total: 256 mg/dL — ABNORMAL HIGH (ref 100–199)
HDL: 39 mg/dL — ABNORMAL LOW (ref >39)
LDL Chol Calc (NIH): 191 mg/dL — ABNORMAL HIGH (ref 0–99)
Triglycerides: 139 mg/dL (ref 0–149)
VLDL Cholesterol Cal: 26 mg/dL (ref 5–40)

## 2021-07-09 LAB — CBC
Hematocrit: 50.3 % (ref 37.5–51.0)
Hemoglobin: 17.1 g/dL (ref 13.0–17.7)
MCH: 28.8 pg (ref 26.6–33.0)
MCHC: 34 g/dL (ref 31.5–35.7)
MCV: 85 fL (ref 79–97)
Platelets: 245 10*3/uL (ref 150–450)
RBC: 5.94 x10E6/uL — ABNORMAL HIGH (ref 4.14–5.80)
RDW: 12.1 % (ref 11.6–15.4)
WBC: 6 10*3/uL (ref 3.4–10.8)

## 2021-07-09 LAB — TSH: TSH: 1.74 u[IU]/mL (ref 0.450–4.500)

## 2021-07-09 MED ORDER — ATORVASTATIN CALCIUM 20 MG PO TABS: 20.0000 mg | ORAL_TABLET | Freq: Every day | ORAL | 3 refills | Status: AC

## 2021-07-15 ENCOUNTER — Other Ambulatory Visit: Payer: Self-pay | Admitting: Family Medicine

## 2021-07-16 ENCOUNTER — Other Ambulatory Visit: Payer: Self-pay | Admitting: Family Medicine

## 2021-07-16 ENCOUNTER — Encounter: Payer: Self-pay | Admitting: Family Medicine

## 2021-07-16 MED ORDER — MECLIZINE HCL 25 MG PO TABS: 25.0000 mg | ORAL_TABLET | Freq: Three times a day (TID) | ORAL | 0 refills | Status: DC | PRN

## 2021-07-16 NOTE — Telephone Encounter (Signed)
Copied from [place] [id number]. Topic: Quick Communication - Rx Refill/Question >> Jul 16, 2021 11:11 AM Tessa Lerner A wrote: Medication: meclizine (ANTIVERT) 25 MG tablet [id number]  Has the patient contacted their pharmacy? No. The patient has spoken previously with Dr. Caryn Section about this medication  (Agent: If no, request that the patient contact the pharmacy for the refill. If patient does not wish to contact the pharmacy document the reason why and proceed with request.) (Agent: If yes, when and what did the pharmacy advise?)  Preferred Pharmacy (with phone number or street name): [facility] [id number], [place] [address] [place] [address] Phone: [phone] Fax: [phone]   Has the patient been seen for an appointment in the last year OR does the patient have an upcoming appointment? Yes.    Agent: Please be advised that RX refills may take up to 3 business days. We ask that you follow-up with your pharmacy.

## 2021-07-16 NOTE — Telephone Encounter (Signed)
Requested medication (s) are due for refill today: yes  Requested medication (s) are on the active medication list: yes  Last refill:  02/28/21  Future visit scheduled: yes  Notes to clinic:  Unable to refill per protocol, cannot delegate.      Requested Prescriptions  Pending Prescriptions Disp Refills   meclizine (ANTIVERT) 25 MG tablet 30 tablet 0    Sig: Take 1 tablet (25 mg total) by mouth 3 (three) times daily as needed for dizziness. TAKE 1 TABLET(25 MG) BY MOUTH EVERY 6 HOURS AS NEEDED FOR DIZZINESS     Not Delegated - Gastroenterology: Antiemetics Failed - 07/16/2021 11:59 AM      Failed - This refill cannot be delegated      Passed - Valid encounter within last 6 months    Recent Outpatient Visits           1 week ago Controlled type 2 diabetes mellitus without complication, without long-term current use of insulin (HCC)   [facility] Malva Limes, MD   1 month ago Elevated BP without diagnosis of hypertension   [facility], Oswaldo Conroy, PA-C   4 months ago Annual physical exam   [facility] Maple Hudson., MD   2 years ago ASCVD (arteriosclerotic cardiovascular disease)   [facility] Maple Hudson., MD   2 years ago Dizziness   [facility] Maple Hudson., MD       Future Appointments             In 2 days Mecum, Oswaldo Conroy, PA-C [facility]   In 2 months Sullivan Lone, Leonette Monarch., MD [facility], [facility]

## 2021-07-16 NOTE — Telephone Encounter (Signed)
Requested medication (s) are due for refill today:   Provider to review  Requested medication (s) are on the active medication list:   Yes  Future visit scheduled:   Yes   Last ordered: 02/28/2021 #30, 0 refill  Non delegated refill   Requested Prescriptions  Pending Prescriptions Disp Refills   meclizine (ANTIVERT) 25 MG tablet [Pharmacy Med Name: MECLIZINE 25MG  RX TABLETS] 30 tablet 0    Sig: TAKE 1 TABLET BY MOUTH THREE TIMES DAILY AS NEEDED FOR DIZZINESS     Not Delegated - Gastroenterology: Antiemetics Failed - 07/15/2021  9:26 PM      Failed - This refill cannot be delegated      Passed - Valid encounter within last 6 months    Recent Outpatient Visits           1 week ago Controlled type 2 diabetes mellitus without complication, without long-term current use of insulin (HCC)   [facility], MD   1 month ago Elevated BP without diagnosis of hypertension   Malva Limes, [employer], PA-C   4 months ago Annual physical exam   [facility]., MD   2 years ago ASCVD (arteriosclerotic cardiovascular disease)   [facility]., MD   2 years ago Dizziness   [facility]., MD       Future Appointments             In 2 months Maple Hudson., MD [facility], [facility]

## 2021-07-18 ENCOUNTER — Ambulatory Visit: Payer: Medicaid Other | Admitting: Physician Assistant

## 2021-10-07 ENCOUNTER — Ambulatory Visit: Payer: Medicaid Other | Admitting: Family Medicine

## 2021-10-15 ENCOUNTER — Ambulatory Visit: Payer: Medicaid Other | Admitting: Family Medicine

## 2021-10-15 NOTE — Progress Notes (Deleted)
?  ? ? ?Established patient visit ? ? ?Patient: Alex Perkins   DOB: 12-05-1985   36 y.o. Male  MRN: [id number] ?Visit Date: 10/15/2021 ? ?Today's healthcare provider: Wilhemena Durie, MD  ? ?No chief complaint on file. ? ?Subjective  ?  ?HPI  ?Diabetes Mellitus Type II, follow-up ? ?Lab Results  ?Component Value Date  ? HGBA1C 5.7 (A) 07/08/2021  ? HGBA1C 7.0 (H) 02/27/2021  ? HGBA1C 6.3 (A) 12/08/2018  ? ?Last seen for diabetes 3 months ago.  ?Management since then includes continuing the same treatment. ?He reports {excellent/good/fair/poor:19665} compliance with treatment. ?He {is/is not:21021397} having side effects. {document side effects if present:1} ? ?Home blood sugar records: {diabetes glucometry results:16657} ? ?Episodes of hypoglycemia? {Yes/No:20286} {enter details if yes:1} ?  ?Current insulin regiment: {***Type 'None' if not taking insulin ?                                               otherwise enter complete  ?                                               details of insulin regiment:1} ?Most Recent Eye Exam: *** ? ?--------------------------------------------------------------------------------------------------- ?Lipid/Cholesterol, follow-up ? ?Last Lipid Panel: ?Lab Results  ?Component Value Date  ? CHOL 256 (H) 07/08/2021  ? LDLCALC 191 (H) 07/08/2021  ? HDL 39 (L) 07/08/2021  ? TRIG 139 07/08/2021  ? ? ?He was last seen for this 3 months ago.  ?Management since that visit includes; labs checked. Started atorvastatin 20 mg. ? ?He reports {excellent/good/fair/poor:19665} compliance with treatment. ?He {is/is not:9024} having side effects. {document side effects if present:1} ? ?He is following a {diet:21022986} diet. ?Current exercise: {exercise types:16438} ? ?Last metabolic panel ?Lab Results  ?Component Value Date  ? GLUCOSE 112 (H) 07/08/2021  ? NA 142 07/08/2021  ? K 4.9 07/08/2021  ? BUN 17 07/08/2021  ? CREATININE 0.98 07/08/2021  ? EGFR 102 07/08/2021  ? GFRNONAA >60 03/01/2019  ?  CALCIUM 9.6 07/08/2021  ? AST 20 07/08/2021  ? ALT 30 07/08/2021  ? ?The ASCVD Risk score (Arnett DK, et al., 2019) failed to calculate for the following reasons: ?  The 2019 ASCVD risk score is only valid for ages 83 to 35 ? ?--------------------------------------------------------------------------------------------------- ? ? ?Medications: ?Outpatient Medications Prior to Visit  ?Medication Sig  ? atorvastatin (LIPITOR) 20 MG tablet Take 1 tablet (20 mg total) by mouth daily.  ? cetirizine (ZYRTEC) 10 MG tablet Take 1 tablet (10 mg total) by mouth daily.  ? Continuous Blood Gluc Sensor (FREESTYLE LIBRE 3 SENSOR) MISC 1 each by Does not apply route every 14 (fourteen) days. Place 1 sensor on the skin every 14 days. Use to check glucose continuously  ? cyclobenzaprine (FLEXERIL) 10 MG tablet cyclobenzaprine 10 mg tablet ? TK 1 T PO Q 8 H PRF SPASM  ? fluticasone (FLONASE) 50 MCG/ACT nasal spray Place 2 sprays into both nostrils daily.  ? glucose blood (CONTOUR NEXT TEST) test strip Use as instructed  ? meclizine (ANTIVERT) 25 MG tablet Take 1 tablet (25 mg total) by mouth 3 (three) times daily as needed for dizziness. TAKE 1 TABLET(25 MG) BY MOUTH EVERY 6 HOURS AS NEEDED FOR DIZZINESS  ?  promethazine (PHENERGAN) 25 MG tablet Take 1 tablet (25 mg total) by mouth every 6 (six) hours as needed for nausea or vomiting.  ? SUMAtriptan (IMITREX) 100 MG tablet Take 1 tablet (100 mg total) by mouth every 2 (two) hours as needed for migraine. May repeat in 2 hours if headache persists or recurs.TAKE 1 TABLET BY MOUTH ONCE AS NEEDED FOR MIGRAINE FOR UP TO 1 DOSE. MAY TAKE A SECOND DOSE AFTER 2 HOURS IF NEEDED  ? ?No facility-administered medications prior to visit.  ? ? ?Review of Systems  ?Constitutional:  Negative for appetite change, chills and fever.  ?Respiratory:  Negative for chest tightness, shortness of breath and wheezing.   ?Cardiovascular:  Negative for chest pain and palpitations.  ?Gastrointestinal:  Negative  for abdominal pain, nausea and vomiting.  ? ?{Labs  Heme  Chem  Endocrine  Serology  Results Review (optional):23779} ?  Objective  ?  ?There were no vitals taken for this visit. ?{Show previous vital signs (optional):23777} ? ?Physical Exam  ?*** ? ?No results found for any visits on 10/15/21. ? Assessment & Plan  ?  ? ?*** ? ?No follow-ups on file.  ?   ? ?{provider attestation***:1} ? ? ?Richard Cranford Mon, MD  ?[facility] ?[phone] (phone) ?[phone] (fax) ? ?[facility] ?

## 2021-12-05 DIAGNOSIS — H1031 Unspecified acute conjunctivitis, right eye: Secondary | ICD-10-CM | POA: Diagnosis not present

## 2021-12-25 DIAGNOSIS — H6991 Unspecified Eustachian tube disorder, right ear: Secondary | ICD-10-CM | POA: Diagnosis not present

## 2021-12-25 DIAGNOSIS — H9201 Otalgia, right ear: Secondary | ICD-10-CM | POA: Diagnosis not present

## 2022-04-29 ENCOUNTER — Other Ambulatory Visit: Payer: Self-pay | Admitting: Family Medicine

## 2022-04-29 ENCOUNTER — Other Ambulatory Visit: Payer: Self-pay

## 2022-04-29 ENCOUNTER — Encounter: Payer: Self-pay | Admitting: Family Medicine

## 2022-04-29 DIAGNOSIS — G43109 Migraine with aura, not intractable, without status migrainosus: Secondary | ICD-10-CM

## 2022-04-29 MED ORDER — FREESTYLE LIBRE 3 SENSOR MISC: 1.0000 | 11 refills | Status: AC

## 2022-04-30 MED ORDER — SUMATRIPTAN SUCCINATE 100 MG PO TABS: 100.0000 mg | ORAL_TABLET | ORAL | 1 refills | Status: DC | PRN

## 2023-09-23 ENCOUNTER — Ambulatory Visit: Payer: Self-pay

## 2023-09-23 NOTE — Telephone Encounter (Signed)
 Chief Complaint: Intermittent chest pain Symptoms: dull pain to left side of chest at nipple area Frequency: started a week ago-intermittent lasting 3-5 mins pain less than 1 Pertinent Negatives: Patient denies SOB, fever, dizziness Disposition: [] ED /[] Urgent Care (no appt availability in office) / [] Appointment(In office/virtual)/ []  [facility]/ [] Home Care/ [x] Refused Recommended Disposition /[] [facility]/ []  Follow-up with PCP Additional Notes: patient called with concerns for dull pain to left side of chest. Patient states pain is less than 1. Patient states he started a restrictive diet on 2/27 eating only meat, fruit and veggies. BP was 130/90 last month-BP this morning is 110/76. Blood sugar is 112 today-last month blood sugar was 140-150. Patient states unsure if pain is muscular or cardiac in nature.   Patient is an established patient with [facility] but prior PCP is no longer with the practice. Patient was last seen in 07/2021.  Per protocol, patient is recommended to be seen in 24 hours. No acute appointments available at PCP office. Patient recommended to UC.  Patient is needing to establish with new provider. Information is given to patient of who is available to accept new patients. Patient was frustrated that he would have to establish with a provider in [facility]. Patient states he is going to call around and talk to other providers. Patient is wanting to be seen this week. Patient states he will call back if he can't find anyone. Patient verbalized understanding and all questions answered.     Copied from CRM [id number]. Topic: Clinical - Red Word Triage >> Sep 23, 2023  8:35 AM Nyra Capes wrote: Red Word that prompted transfer to Nurse Triage: Patient called in. He started  on extreme diet on 2/27  eating only meat, fruit, veggie  last month BP was 130/90 and BP this morning is  110/76, blood sugar in February was 140 to 150 this morning is 112,  dull chest pain in upper  left quadrant on and off, pain -1 pt is would like an appt today to have a good once over by provider today.       Would this be red word Reason for Disposition  [1] Chest pain lasts < 5 minutes AND [2] NO chest pain or cardiac symptoms (e.g., breathing difficulty, sweating) now  (Exception: Chest pains that last only a few seconds.)  Answer Assessment - Initial Assessment Questions 1. LOCATION: "Where does it hurt?"       Left chest area around nipple area 2. RADIATION: "Does the pain go anywhere else?" (e.g., into neck, jaw, arms, back)     No radiation 3. ONSET: "When did the chest pain begin?" (Minutes, hours or days)      Started a week ago 4. PATTERN: "Does the pain come and go, or has it been constant since it started?"  "Does it get worse with exertion?"      Comes and goes 5. DURATION: "How long does it last" (e.g., seconds, minutes, hours)     Less than 3-5 minutes 6. SEVERITY: "How bad is the pain?"  (e.g., Scale 1-10; mild, moderate, or severe)    - MILD (1-3): doesn't interfere with normal activities     - MODERATE (4-7): interferes with normal activities or awakens from sleep    - SEVERE (8-10): excruciating pain, unable to do any normal activities       Mild-dull 7. CARDIAC RISK FACTORS: "Do you have any history of heart problems or risk factors for heart disease?" (e.g., angina,  prior heart attack; diabetes, high blood pressure, high cholesterol, smoker, or strong family history of heart disease)     Diabetes, high blood pressure, high cholesterol 8. PULMONARY RISK FACTORS: "Do you have any history of lung disease?"  (e.g., blood clots in lung, asthma, emphysema, birth control pills)     no 9. CAUSE: "What do you think is causing the chest pain?"     unsure 10. OTHER SYMPTOMS: "Do you have any other symptoms?" (e.g., dizziness, nausea, vomiting, sweating, fever, difficulty breathing, cough)       No  Protocols used: Chest Pain-A-AH

## 2023-09-30 DIAGNOSIS — M546 Pain in thoracic spine: Secondary | ICD-10-CM | POA: Diagnosis not present

## 2023-09-30 DIAGNOSIS — M545 Low back pain, unspecified: Secondary | ICD-10-CM | POA: Diagnosis not present

## 2023-11-30 ENCOUNTER — Ambulatory Visit: Admitting: Urology

## 2023-12-13 ENCOUNTER — Ambulatory Visit: Admitting: Urology

## 2024-01-10 ENCOUNTER — Telehealth: Payer: Self-pay

## 2024-01-10 NOTE — Telephone Encounter (Signed)
 Copied from CRM [id number]. Topic: Appointments - Transfer of Care >> Jan 10, 2024 10:56 AM Precious C wrote: Pt is requesting to transfer FROM: [facility] at [facility] is requesting to transfer TO: [facility] Reason for requested transfer: Dr Charlie Forte is no longer available It is the responsibility of the team the patient would like to transfer to (Dr. Ziglar, Devere Iha) to reach out to the patient if for any reason this transfer is not acceptable. >> Jan 10, 2024 11:29 AM Leotis ORN wrote: Correcting routing error- patient is TOC from [facility] not [facility]

## 2024-01-11 ENCOUNTER — Ambulatory Visit: Admitting: Family Medicine

## 2024-01-11 ENCOUNTER — Encounter: Payer: Self-pay | Admitting: Family Medicine

## 2024-01-11 VITALS — BP 140/89 | HR 84 | Temp 98.2°F | Resp 20 | Ht 73.0 in | Wt 266.0 lb

## 2024-01-11 DIAGNOSIS — R6882 Decreased libido: Secondary | ICD-10-CM | POA: Diagnosis not present

## 2024-01-11 DIAGNOSIS — R7303 Prediabetes: Secondary | ICD-10-CM | POA: Diagnosis not present

## 2024-01-11 DIAGNOSIS — L723 Sebaceous cyst: Secondary | ICD-10-CM | POA: Insufficient documentation

## 2024-01-11 DIAGNOSIS — E782 Mixed hyperlipidemia: Secondary | ICD-10-CM

## 2024-01-11 DIAGNOSIS — R030 Elevated blood-pressure reading, without diagnosis of hypertension: Secondary | ICD-10-CM | POA: Diagnosis not present

## 2024-01-11 DIAGNOSIS — K219 Gastro-esophageal reflux disease without esophagitis: Secondary | ICD-10-CM

## 2024-01-11 DIAGNOSIS — R5383 Other fatigue: Secondary | ICD-10-CM | POA: Diagnosis not present

## 2024-01-11 LAB — POCT GLYCOSYLATED HEMOGLOBIN (HGB A1C): Hemoglobin A1C: 5.9 % — AB (ref 4.0–5.6)

## 2024-01-11 NOTE — Assessment & Plan Note (Addendum)
 Has had an A1c as high as 7.0% in the past.  Controls his diabetes with diet and exercise.  A1c today 5.9%.

## 2024-01-11 NOTE — Progress Notes (Signed)
 Established Patient Office Visit  Subjective   Patient ID: DELIA SITAR, male    DOB: 10-11-1985  Age: 38 y.o. MRN: [id number]  Chief Complaint  Patient presents with   Establish Care    Fasting   Cyst    On back since 2018   Diabetes   Hypertension    Diabetes  Hypertension  Discussed the use of AI scribe software for clinical note transcription with the patient, who gave verbal consent to proceed.  History of Present Illness   Alex Perkins is a 38 year old male with type 2 diabetes who presents for management of his diabetes and weight loss.  He reports he checks his blood pressure at home and it typically runs in the 120s over 70s.  He believes he has whitecoat hypertension as his blood pressure is always elevated in a medical setting.  He has never been treated for hypertension.  He has a history of type 2 diabetes, hypertension, and elevated cholesterol. He initially lost 40 pounds, weight (306 to 267) reducing his A1c from 7.0% (02/27/2021) to 5.5% but subsequently regained 20 pounds. He lost the weight by changing his diet and avoiding carbs and sugars.  Recently, he resumed a low sugar and low carbohydrate diet, resulting in a weight reduction from 289 pounds in February to 266 pounds currently. He monitors his fasting blood glucose levels, which have varied from 100-109 mg/dL to a peak of 854-849 mg/dL before dietary changes. He denies current use of cholesterol medication or aspirin.  07/08/2021 total cholesterol 256, Triggs 139, HDL 39 and LDL 191.  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 He uses sumatriptan  for acute episodes and has experienced vertigo associated with migraines, described as 'eight-hour spells' of vision loss and spinning. 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  No current issues with migraines.  He reports a history of reflux, which is controlled through  diet. He occasionally experiences difficulty swallowing when his throat feels dry, but this is not a frequent issue. No current issues with reflux when adhering to his diet.  He has a history of a bulging lumbar disc for which he received ESI in the past, which were effective. He has not had surgery for this condition and reports no current back issues.  He experiences occasional chest pain, particularly with physical exertion, but has not had a heart attack. He went to the ER once for chest pains some years ago.  He works in [employer], and experiences pain that he sometimes wonders could be related to muscle or other causes.  He has a cyst on his back that has been present since 2018, which recently became sore after exposure to heat and sun at the beach. It has not been previously infected.          ROS    Objective:     BP (!) 140/89 (BP Location: Left Arm, Patient Position: Sitting, Cuff Size: Normal)   Pulse 84   Temp 98.2 F (36.8 C) (Oral)   Resp 20   Ht 6' 1 (1.854 m)   Wt 266 lb (120.7 kg)   SpO2 96%   BMI 35.09 kg/m    Physical Exam Vitals and nursing note reviewed.  Constitutional:      Appearance: Normal appearance.  HENT:     Head: Normocephalic and atraumatic.     Nose: Nose normal.  Eyes:     Conjunctiva/sclera: Conjunctivae normal.  Cardiovascular:     Rate and Rhythm: Normal rate and regular rhythm.  Pulmonary:     Effort: Pulmonary effort is normal.     Breath sounds: Normal breath sounds.  Musculoskeletal:     Right lower leg: No edema.     Left lower leg: No edema.  Skin:    General: Skin is warm and dry.     Comments: Sebaceous cyst thoracic back, no erythema or tenderness to palpation  Neurological:     Mental Status: He is alert and oriented to person, place, and time.  Psychiatric:        Mood and Affect: Mood normal.        Behavior: Behavior normal.        Thought Content: Thought content normal.         Judgment: Judgment normal.          Results for orders placed or performed in visit on 01/11/24  POCT glycosylated hemoglobin (Hb A1C)  Result Value Ref Range   Hemoglobin A1C 5.9 (A) 4.0 - 5.6 %   HbA1c POC (<> result, manual entry)     HbA1c, POC (prediabetic range)     HbA1c, POC (controlled diabetic range)        The 10-year ASCVD risk score (Arnett DK, et al., 2019) is: 3.1%    Assessment & Plan:  Prediabetes Assessment & Plan: Has had an A1c as high as 7.0% in the past.  Controls his diabetes with diet and exercise.  A1c today 5.9%.    Orders: -     POCT glycosylated hemoglobin (Hb A1C) -     CT CARDIAC SCORING (SELF PAY ONLY); Future  Other fatigue -     CBC with Differential/Platelet -     Comprehensive metabolic panel with GFR -     TSH + free T4  Mixed hyperlipidemia Assessment & Plan: Was prescribed Lipitor at one point but never took it.  Does not want to take cholesterol medication.  Ask him to take an 81 mg aspirin and his evening meal.  Discussed getting coronary calcium  score as a tool for risk ratification for taking cholesterol medication and for understanding his CVD risk more clearly. He would be willing to pay for this test.   Fasting lipid profile today.  Orders: -     Lipid panel  Low libido -     Testosterone ,Free and Total  Sebaceous cyst Assessment & Plan: Has a benign sebaceous cyst on his back.  Would like to go to dermatology to have this removed.  Referral placed  Orders: -     Ambulatory referral to Dermatology  Elevated BP without diagnosis of hypertension Assessment & Plan: Checks his blood pressure at home and gets 110s over 70s.  Feel like he has whitecoat hypertension.   Gastroesophageal reflux disease, unspecified whether esophagitis present Assessment & Plan: Controls his GERD with his diet.  Currently no pharmacologic therapy      Return in about 6 months (around 07/13/2024).    Kharis Lapenna K Roemello Speyer, MD

## 2024-01-11 NOTE — Assessment & Plan Note (Signed)
 Checks his blood pressure at home and gets 110s over 70s.  Feel like he has whitecoat hypertension.

## 2024-01-11 NOTE — Assessment & Plan Note (Signed)
 Controls his GERD with his diet.  Currently no pharmacologic therapy

## 2024-01-11 NOTE — Assessment & Plan Note (Addendum)
 Has a benign sebaceous cyst on his back.  Would like to go to dermatology to have this removed.  Referral placed

## 2024-01-11 NOTE — Assessment & Plan Note (Addendum)
 Was prescribed Lipitor at one point but never took it.  Does not want to take cholesterol medication.  Ask him to take an 81 mg aspirin and his evening meal.  Discussed getting coronary calcium  score as a tool for risk ratification for taking cholesterol medication and for understanding his CVD risk more clearly. He would be willing to pay for this test.   Fasting lipid profile today.

## 2024-01-12 ENCOUNTER — Ambulatory Visit: Payer: Self-pay | Admitting: Family Medicine

## 2024-01-12 LAB — CBC WITH DIFFERENTIAL/PLATELET
Basophils Absolute: 0.1 x10E3/uL (ref 0.0–0.2)
Basos: 1 %
EOS (ABSOLUTE): 0.1 x10E3/uL (ref 0.0–0.4)
Eos: 2 %
Hematocrit: 53.6 % — ABNORMAL HIGH (ref 37.5–51.0)
Hemoglobin: 17.2 g/dL (ref 13.0–17.7)
Immature Grans (Abs): 0.1 x10E3/uL (ref 0.0–0.1)
Immature Granulocytes: 1 %
Lymphocytes Absolute: 2.6 x10E3/uL (ref 0.7–3.1)
Lymphs: 37 %
MCH: 28.8 pg (ref 26.6–33.0)
MCHC: 32.1 g/dL (ref 31.5–35.7)
MCV: 90 fL (ref 79–97)
Monocytes Absolute: 0.6 x10E3/uL (ref 0.1–0.9)
Monocytes: 9 %
Neutrophils Absolute: 3.5 x10E3/uL (ref 1.4–7.0)
Neutrophils: 50 %
Platelets: 237 x10E3/uL (ref 150–450)
RBC: 5.98 x10E6/uL — ABNORMAL HIGH (ref 4.14–5.80)
RDW: 12.5 % (ref 11.6–15.4)
WBC: 6.9 x10E3/uL (ref 3.4–10.8)

## 2024-01-12 LAB — LIPID PANEL
Chol/HDL Ratio: 6.4 ratio — ABNORMAL HIGH (ref 0.0–5.0)
Cholesterol, Total: 242 mg/dL — ABNORMAL HIGH (ref 100–199)
HDL: 38 mg/dL — ABNORMAL LOW (ref >39)
LDL Chol Calc (NIH): 175 mg/dL — ABNORMAL HIGH (ref 0–99)
Triglycerides: 159 mg/dL — ABNORMAL HIGH (ref 0–149)
VLDL Cholesterol Cal: 29 mg/dL (ref 5–40)

## 2024-01-12 LAB — TSH+FREE T4
Free T4: 1.32 ng/dL (ref 0.82–1.77)
TSH: 1.51 u[IU]/mL (ref 0.450–4.500)

## 2024-01-12 LAB — COMPREHENSIVE METABOLIC PANEL WITH GFR
ALT: 33 IU/L (ref 0–44)
AST: 19 IU/L (ref 0–40)
Albumin: 4.7 g/dL (ref 4.1–5.1)
Alkaline Phosphatase: 76 IU/L (ref 44–121)
BUN/Creatinine Ratio: 15 (ref 9–20)
BUN: 16 mg/dL (ref 6–24)
Bilirubin Total: 0.5 mg/dL (ref 0.0–1.2)
CO2: 21 mmol/L (ref 20–29)
Calcium: 10 mg/dL (ref 8.7–10.2)
Chloride: 100 mmol/L (ref 96–106)
Creatinine, Ser: 1.08 mg/dL (ref 0.76–1.27)
Globulin, Total: 2.6 g/dL (ref 1.5–4.5)
Glucose: 109 mg/dL — ABNORMAL HIGH (ref 70–99)
Potassium: 4.7 mmol/L (ref 3.5–5.2)
Sodium: 141 mmol/L (ref 134–144)
Total Protein: 7.3 g/dL (ref 6.0–8.5)
eGFR: 89 mL/min/1.73 (ref >59)

## 2024-01-12 LAB — TESTOSTERONE,FREE AND TOTAL
Testosterone, Free: 12.9 pg/mL (ref 6.8–21.5)
Testosterone: 595 ng/dL (ref 264–916)

## 2024-01-16 ENCOUNTER — Encounter: Payer: Self-pay | Admitting: Family Medicine

## 2024-01-17 ENCOUNTER — Other Ambulatory Visit: Payer: Self-pay | Admitting: Family Medicine

## 2024-01-17 DIAGNOSIS — L723 Sebaceous cyst: Secondary | ICD-10-CM

## 2024-01-17 MED ORDER — DOXYCYCLINE HYCLATE 100 MG PO CAPS: 100.0000 mg | ORAL_CAPSULE | Freq: Two times a day (BID) | ORAL | 0 refills | Status: DC

## 2024-01-19 ENCOUNTER — Ambulatory Visit
Admission: RE | Admit: 2024-01-19 | Discharge: 2024-01-19 | Disposition: A | Discharge status: Home / Self Care | Payer: Self-pay | Source: Ambulatory Visit | Attending: Family Medicine | Admitting: Family Medicine

## 2024-01-19 DIAGNOSIS — R7303 Prediabetes: Secondary | ICD-10-CM | POA: Insufficient documentation

## 2024-02-02 ENCOUNTER — Encounter: Payer: Self-pay | Admitting: Family Medicine

## 2024-02-02 ENCOUNTER — Other Ambulatory Visit: Payer: Self-pay | Admitting: Family Medicine

## 2024-02-02 DIAGNOSIS — F419 Anxiety disorder, unspecified: Secondary | ICD-10-CM

## 2024-02-02 MED ORDER — PROPRANOLOL HCL 10 MG PO TABS: ORAL_TABLET | ORAL | 1 refills | Status: AC

## 2024-02-02 MED ORDER — PROPRANOLOL HCL 10 MG PO TABS: 10.0000 mg | ORAL_TABLET | Freq: Two times a day (BID) | ORAL | Status: DC

## 2024-02-08 ENCOUNTER — Ambulatory Visit: Admitting: Urology

## 2024-02-24 ENCOUNTER — Encounter: Payer: Self-pay | Admitting: Family Medicine

## 2024-03-01 ENCOUNTER — Encounter: Payer: Self-pay | Admitting: Family Medicine

## 2024-03-02 DIAGNOSIS — L0231 Cutaneous abscess of buttock: Secondary | ICD-10-CM | POA: Diagnosis not present

## 2024-03-08 ENCOUNTER — Encounter: Payer: Self-pay | Admitting: Family Medicine

## 2024-03-08 ENCOUNTER — Ambulatory Visit: Admitting: Family Medicine

## 2024-03-08 VITALS — BP 144/105 | HR 67 | Temp 98.3°F | Resp 20 | Ht 73.0 in | Wt 273.0 lb

## 2024-03-08 DIAGNOSIS — L0291 Cutaneous abscess, unspecified: Secondary | ICD-10-CM | POA: Diagnosis not present

## 2024-03-08 NOTE — Assessment & Plan Note (Signed)
 Mostly resolved.  No indication of infection.  Healing well.

## 2024-03-08 NOTE — Progress Notes (Signed)
   Established Patient Office Visit  Subjective   [name], male    DOB: June 21, 1986  Age: 38 y.o. MRN: [id number]  Chief Complaint  Patient presents with   Cyst    Near rectum    HPI Had an abscess near his anus.  Pain got so bad that he went to the ER August 28th at 4:30 in the morning and got it lanced.  He has an appointment for general surgery next week.  The abscess has been draining until 2 days ago and the drainage stopped.  He is on doxycycline  100mg  BID.  Pain is greatly reduced.   Also had a sebaceous cyst on his back that ruptured and this has resolved.      ROS    Objective:     BP (!) 144/105 (BP Location: Right Arm, Patient Position: Sitting, Cuff Size: Large)   Pulse 67   Temp 98.3 F (36.8 C) (Oral)   Resp 20   Ht 6' 1 (1.854 m)   Wt 273 lb (123.8 kg)   SpO2 96%   BMI 36.02 kg/m    Physical Exam Vitals reviewed.  Constitutional:      Appearance: Normal appearance.  HENT:     Head: Normocephalic.  Eyes:     General:        Right eye: No discharge.        Left eye: No discharge.  Cardiovascular:     Rate and Rhythm: Normal rate.  Pulmonary:     Effort: Pulmonary effort is normal.  Skin:    Comments: Open wound 3-4 mm, 8 o'clock.  No surrounding erythema or edema.  No drainage   Neurological:     Mental Status: He is alert and oriented to person, place, and time.  Psychiatric:        Mood and Affect: Mood normal.        Behavior: Behavior normal.        Thought Content: Thought content normal.        Judgment: Judgment normal.          No results found for any visits on 03/08/24.    The 10-year ASCVD risk score (Arnett DK, et al., 2019) is: 3%    Assessment & Plan:  Abscess Assessment & Plan: Mostly resolved.  No indication of infection.  Healing well.        Return if symptoms worsen or fail to improve.    Adrianne Shackleton K Chalice Philbert, MD

## 2024-03-19 ENCOUNTER — Encounter: Payer: Self-pay | Admitting: Family Medicine

## 2024-03-20 ENCOUNTER — Encounter: Payer: Self-pay | Admitting: Family Medicine

## 2024-03-20 ENCOUNTER — Ambulatory Visit: Admitting: Family Medicine

## 2024-03-20 VITALS — BP 121/88 | HR 87 | Temp 98.1°F | Resp 18 | Ht 73.0 in | Wt 273.0 lb

## 2024-03-20 DIAGNOSIS — L0291 Cutaneous abscess, unspecified: Secondary | ICD-10-CM | POA: Diagnosis not present

## 2024-03-20 DIAGNOSIS — R3989 Other symptoms and signs involving the genitourinary system: Secondary | ICD-10-CM | POA: Diagnosis not present

## 2024-03-20 MED ORDER — FLUCONAZOLE 200 MG PO TABS: 200.0000 mg | ORAL_TABLET | ORAL | 0 refills | Status: AC

## 2024-03-20 MED ORDER — FLUCONAZOLE 200 MG PO TABS
200.0000 mg | ORAL_TABLET | ORAL | 0 refills | Status: DC
Start: 2024-03-20 — End: 2024-03-20

## 2024-03-20 NOTE — Assessment & Plan Note (Signed)
 Does not appear infected.  Closing very slowly with granulation tissue in the base.  I think it would be best to follow-up with a surgeon to make sure this closes by secondary intention.

## 2024-03-20 NOTE — Assessment & Plan Note (Signed)
 Likely tinea.  Diflucan  200mg  every 3 days for 4 doses.

## 2024-03-20 NOTE — Progress Notes (Signed)
 Established Patient Office Visit  Subjective   [name], male    DOB: [birth year]  Age: 38 y.o. MRN: [id number]  Chief Complaint  Patient presents with   Cyst    HPI Discussed the use of AI scribe software for clinical note transcription with the patient, who gave verbal consent to proceed.  History of Present Illness   Alex Perkins is a 38 year old male who presents with a recurrent abscess in the buttock area.  He has a recurrent abscess in the buttock area, which was lanced approximately three weeks ago. He has been taking doxycycline  BID since then.  Initially, he was referred to a surgeon, but during a previous visit, it was noted that the abscess appeared to be healing well, so he did not schedule a surgical consultation at that time. The abscess has taken a long time to resolve, similar to a previous occurrence that also took about three weeks to heal. He mentions feeling some 'weirdness' in the area when he got home this afternoon, but otherwise has not experienced significant symptoms.  He has been prescribed doxycycline  which he started after lancing in the ER.    Now he has pain in his penis.  It does not hurt to void but it burns and stings in his penis after voiding,  He had a prescription for diflucan  200 mg 1 a week for 4 weeks but he did not get this filed.      ROS    Objective:     BP 121/88 (BP Location: Left Arm, Patient Position: Sitting, Cuff Size: Normal)   Pulse 87   Temp 98.1 F (36.7 C) (Oral)   Resp 18   Ht 6' 1 (1.854 m)   Wt 273 lb (123.8 kg)   SpO2 95%   BMI 36.02 kg/m    Physical Exam Vitals reviewed. Exam conducted with a chaperone present.  Constitutional:      Appearance: Normal appearance.  HENT:     Head: Normocephalic.  Eyes:     General:        Right eye: No discharge.        Left eye: No discharge.  Cardiovascular:     Rate and Rhythm: Normal rate.  Pulmonary:     Effort: Pulmonary effort is normal.   Abdominal:     Hernia: There is no hernia in the left inguinal area or right inguinal area.  Genitourinary:    Comments: Has an open draining wound at 2 o'clock at the anal verge.  Granulation tissue present.  No surrounding erythema or edema.   Neurological:     Mental Status: He is alert and oriented to person, place, and time.  Psychiatric:        Mood and Affect: Mood normal.        Behavior: Behavior normal.        Thought Content: Thought content normal.        Judgment: Judgment normal.          No results found for any visits on 03/20/24.    The 10-year ASCVD risk score (Arnett DK, et al., 2019) is: 2.2%    Assessment & Plan:  Abscess Assessment & Plan: Does not appear infected.  Closing very slowly with granulation tissue in the base.  I think it would be best to follow-up with a surgeon to make sure this closes by secondary intention.   Urethral pain Assessment & Plan: Likely tinea.  Diflucan  200mg  every 3 days for 4 doses.     Other orders -     Fluconazole ; Take 1 tablet (200 mg total) by mouth 3 days for 4 doses.  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 Currently open and draining, which is the desired outcome. Slight erythema present, but improved since last examination. Previous referral to a surgeon was not followed as it appeared to be healing well. - Refer to a surgeon for evaluation and potential removal of the abscess remnant. - Prescribe Diflucan  for associated symptoms.         No follow-ups on file.    Author Hat[place] K Jahliyah Trice, MD

## 2024-03-21 ENCOUNTER — Encounter: Payer: Self-pay | Admitting: Family Medicine

## 2024-03-24 DIAGNOSIS — N200 Calculus of kidney: Secondary | ICD-10-CM | POA: Diagnosis not present

## 2024-03-24 DIAGNOSIS — K611 Rectal abscess: Secondary | ICD-10-CM | POA: Diagnosis not present

## 2024-03-25 ENCOUNTER — Encounter: Payer: Self-pay | Admitting: Family Medicine

## 2024-04-18 ENCOUNTER — Encounter: Payer: Self-pay | Admitting: Family Medicine

## 2024-04-18 DIAGNOSIS — G43109 Migraine with aura, not intractable, without status migrainosus: Secondary | ICD-10-CM

## 2024-04-18 MED ORDER — MECLIZINE HCL 25 MG PO TABS
25.0000 mg | ORAL_TABLET | Freq: Three times a day (TID) | ORAL | 3 refills | Status: AC | PRN
Start: 2024-04-18 — End: ?

## 2024-04-18 MED ORDER — SUMATRIPTAN SUCCINATE 100 MG PO TABS: 100.0000 mg | ORAL_TABLET | ORAL | 3 refills | Status: AC | PRN

## 2024-04-18 MED ORDER — PROMETHAZINE HCL 25 MG PO TABS
25.0000 mg | ORAL_TABLET | Freq: Four times a day (QID) | ORAL | 3 refills | Status: AC | PRN
Start: 2024-04-18 — End: ?

## 2024-05-02 ENCOUNTER — Encounter: Payer: Self-pay | Admitting: Family Medicine

## 2024-05-17 ENCOUNTER — Other Ambulatory Visit: Payer: Self-pay | Admitting: Family Medicine

## 2024-05-17 DIAGNOSIS — F419 Anxiety disorder, unspecified: Secondary | ICD-10-CM

## 2024-05-17 MED ORDER — BUSPIRONE HCL 5 MG PO TABS: ORAL_TABLET | ORAL | 1 refills | Status: AC

## 2024-05-19 ENCOUNTER — Other Ambulatory Visit: Payer: Self-pay | Admitting: Family Medicine

## 2024-05-19 DIAGNOSIS — M545 Low back pain, unspecified: Secondary | ICD-10-CM

## 2024-05-19 MED ORDER — CYCLOBENZAPRINE HCL 10 MG PO TABS: 10.0000 mg | ORAL_TABLET | Freq: Three times a day (TID) | ORAL | 1 refills | Status: AC | PRN

## 2024-06-26 ENCOUNTER — Other Ambulatory Visit: Payer: Self-pay | Admitting: Family Medicine

## 2024-06-26 ENCOUNTER — Encounter: Payer: Self-pay | Admitting: Family Medicine

## 2024-06-26 DIAGNOSIS — L723 Sebaceous cyst: Secondary | ICD-10-CM

## 2024-06-26 MED ORDER — DOXYCYCLINE HYCLATE 100 MG PO CAPS: 100.0000 mg | ORAL_CAPSULE | Freq: Two times a day (BID) | ORAL | 0 refills | Status: AC

## 2024-06-26 NOTE — Telephone Encounter (Signed)
 Please see mychart message sent by pt and advise.

## 2024-07-13 ENCOUNTER — Ambulatory Visit: Admitting: Family Medicine
